# Patient Record
Sex: Female | Born: 1945
Health system: Southern US, Community
[De-identification: ages and names within clinical notes are randomized; demographics above are authoritative.]

## PROBLEM LIST (undated history)

## (undated) DIAGNOSIS — E119 Type 2 diabetes mellitus without complications: Secondary | ICD-10-CM

## (undated) DIAGNOSIS — I739 Peripheral vascular disease, unspecified: Secondary | ICD-10-CM

## (undated) DIAGNOSIS — R0789 Other chest pain: Secondary | ICD-10-CM

## (undated) DIAGNOSIS — E7800 Pure hypercholesterolemia, unspecified: Secondary | ICD-10-CM

## (undated) DIAGNOSIS — K5792 Diverticulitis of intestine, part unspecified, without perforation or abscess without bleeding: Secondary | ICD-10-CM

## (undated) DIAGNOSIS — I10 Essential (primary) hypertension: Secondary | ICD-10-CM

## (undated) DIAGNOSIS — N189 Chronic kidney disease, unspecified: Secondary | ICD-10-CM

## (undated) DIAGNOSIS — E785 Hyperlipidemia, unspecified: Secondary | ICD-10-CM

## (undated) HISTORY — PX: ABDOMINAL HYSTERECTOMY: SHX81

## (undated) HISTORY — PX: FEMORAL ARTERY STENT: SHX1583

---

## 1998-01-30 ENCOUNTER — Inpatient Hospital Stay (HOSPITAL_COMMUNITY): Admission: EM | Admit: 1998-01-30 | Discharge: 1998-02-01 | Payer: Self-pay | Admitting: Emergency Medicine

## 2011-06-04 ENCOUNTER — Emergency Department (HOSPITAL_BASED_OUTPATIENT_CLINIC_OR_DEPARTMENT_OTHER)
Admission: EM | Admit: 2011-06-04 | Discharge: 2011-06-04 | Disposition: A | Discharge status: Home / Self Care | Payer: Medicare Other | Attending: Emergency Medicine | Admitting: Emergency Medicine

## 2011-06-04 ENCOUNTER — Encounter: Payer: Self-pay | Admitting: Emergency Medicine

## 2011-06-04 ENCOUNTER — Emergency Department (INDEPENDENT_AMBULATORY_CARE_PROVIDER_SITE_OTHER): Payer: Medicare Other

## 2011-06-04 ENCOUNTER — Other Ambulatory Visit: Payer: Self-pay

## 2011-06-04 DIAGNOSIS — E785 Hyperlipidemia, unspecified: Secondary | ICD-10-CM | POA: Insufficient documentation

## 2011-06-04 DIAGNOSIS — F17200 Nicotine dependence, unspecified, uncomplicated: Secondary | ICD-10-CM | POA: Insufficient documentation

## 2011-06-04 DIAGNOSIS — R0789 Other chest pain: Secondary | ICD-10-CM

## 2011-06-04 DIAGNOSIS — R079 Chest pain, unspecified: Secondary | ICD-10-CM | POA: Insufficient documentation

## 2011-06-04 DIAGNOSIS — I10 Essential (primary) hypertension: Secondary | ICD-10-CM | POA: Insufficient documentation

## 2011-06-04 DIAGNOSIS — R071 Chest pain on breathing: Secondary | ICD-10-CM | POA: Insufficient documentation

## 2011-06-04 DIAGNOSIS — I517 Cardiomegaly: Secondary | ICD-10-CM

## 2011-06-04 DIAGNOSIS — Z79899 Other long term (current) drug therapy: Secondary | ICD-10-CM | POA: Insufficient documentation

## 2011-06-04 HISTORY — DX: Essential (primary) hypertension: I10

## 2011-06-04 HISTORY — DX: Peripheral vascular disease, unspecified: I73.9

## 2011-06-04 HISTORY — DX: Hyperlipidemia, unspecified: E78.5

## 2011-06-04 LAB — BASIC METABOLIC PANEL
GFR calc Af Amer: >60 mL/min (ref >60)
GFR calc non Af Amer: >60 mL/min (ref >60)
Potassium: 4 mEq/L (ref 3.5–5.1)
Sodium: 136 mEq/L (ref 135–145)

## 2011-06-04 LAB — CARDIAC PANEL(CRET KIN+CKTOT+MB+TROPI)
CK, MB: 2.5 ng/mL (ref 0.3–4.0)
Total CK: 99 U/L (ref 7–177)
Troponin I: <0.3 ng/mL (ref <0.30)

## 2011-06-04 LAB — CBC
MCHC: 33.4 g/dL (ref 30.0–36.0)
RDW: 18.2 % — ABNORMAL HIGH (ref 11.5–15.5)

## 2011-06-04 LAB — HEPATIC FUNCTION PANEL
Bilirubin, Direct: <0.1 mg/dL (ref 0.0–0.3)
Total Bilirubin: 0.2 mg/dL — ABNORMAL LOW (ref 0.3–1.2)

## 2011-06-04 MED ORDER — CIPROFLOXACIN IN D5W 400 MG/200ML IV SOLN
400.0000 mg | Freq: Once | INTRAVENOUS | Status: DC
Start: 2011-06-04 — End: 2011-06-04

## 2011-06-04 NOTE — ED Provider Notes (Signed)
History    the patient presents with left axilla pain. She was the sudden onset of pain as she was reaching up overhead to/sharp her back. The pain is sharp nonradiating, and focally about the midpoint of the left axilla. She recalls a sensation of shortness of breath. She denies other pain at that time. The pain lasted a few moments, and resolved spontaneously. The patient on ED presentation has no complaints. She notes that she has a history of prior cardiac evaluation within the last 2 years, this includes a negative chemical stress test. She has been her usual state of health, denies any focal changes other than the events preceding to presentation.  CSN: [id number] Arrival date & time: 06/04/2011 12:16 AM  Chief Complaint  Patient presents with  . Chest Pain    HPI  (Consider location/radiation/quality/duration/timing/severity/associated sxs/prior treatment)  HPI  Past Medical History  Diagnosis Date  . Hypertension   . Hyperlipidemia   . Peripheral vascular disease     Past Surgical History  Procedure Date  . Femoral artery stent   . Abdominal hysterectomy     No family history on file.  History  Substance Use Topics  . Smoking status: Current Some Day Smoker  . Smokeless tobacco: Not on file  . Alcohol Use: No    OB History    Grav Para Term Preterm Abortions TAB SAB Ect Mult Living                  Review of Systems  Review of Systems Gen: Per HPI HEENT: No HA CV: No CP Resp: No dyspnea Abd: Per HPI, otherwise negative Musk: Per HPI, otherwise negative Neuro: No dysesthesia, or focal changes GU: Per HPI, otherwise negative Skin: Neg Psych: Neg  Allergies  Review of patient's allergies indicates no known allergies.  Home Medications   Current Outpatient Rx  Name Route Sig Dispense Refill  . B-COMPLEX/B-12 PO Oral Take by mouth.      . CLOPIDOGREL BISULFATE 75 MG PO TABS Oral Take 75 mg by mouth daily.      Marland Kitchen HYDROCHLOROTHIAZIDE 25 MG PO TABS  Oral Take 25 mg by mouth daily.      Marland Kitchen LISINOPRIL 40 MG PO TABS Oral Take 40 mg by mouth daily.      Marland Kitchen PRAVASTATIN SODIUM 20 MG PO TABS Oral Take 20 mg by mouth daily.        Physical Exam    BP 118/58  Pulse 70  Temp(Src) 97.9 F (36.6 C) (Oral)  Resp 18  SpO2 100%  Physical Exam  Constitutional: She is oriented to person, place, and time. She appears well-developed and well-nourished.  HENT:  Head: Normocephalic and atraumatic.  Eyes: EOM are normal.  Cardiovascular: Normal rate and regular rhythm.   Pulmonary/Chest: Effort normal and breath sounds normal. No respiratory distress. She has no wheezes. She has no rales. She exhibits no tenderness.  Abdominal: She exhibits no distension.  Musculoskeletal: She exhibits no edema and no tenderness.  Neurological: She is alert and oriented to person, place, and time.  Skin: Skin is warm and dry.    ED Course  Procedures (including critical care time)  Labs Reviewed  CBC - Abnormal; Notable for the following:    RDW 18.2 (*)    All other components within normal limits  BASIC METABOLIC PANEL - Abnormal; Notable for the following:    Glucose, Bld 105 (*)    All other components within normal limits  HEPATIC FUNCTION  PANEL - Abnormal; Notable for the following:    Total Bilirubin 0.2 (*)    All other components within normal limits  CARDIAC PANEL(CRET KIN+CKTOT+MB+TROPI)   Dg Chest 2 View  06/04/2011  *RADIOLOGY REPORT*  Clinical Data: Left upper chest pain.  CHEST - 2 VIEW  Comparison: None.  Findings: Heart size is upper normal.  Normal mediastinal and hilar contours.  Normal pulmonary vascularity.  The lungs are clear. There is no pleural effusion or pneumothorax.  There are degenerative changes of the thoracic spine.  No acute bony abnormality.  IMPRESSION: No acute   Date: 06/04/2011  Rate:52  Rhythm: sinus bradycardia  QRS Axis: normal  Intervals: normal  ST/T Wave abnormalities: normal  Conduction Disutrbances:none   Narrative Interpretation: Q waves (borderline) anteriorly  Old EKG Reviewed: none available   cardiopulmonary disease. Borderline cardiomegaly.  Original Report Authenticated By: Britta Mccreedy, M.D.     No diagnosis found.   MDM This 65 year old female presents with the acute onset of left axillary pain that occurred while she was reaching above her head for a shower curtain. The transient nature of the pain, the patient's absence of complaints on exam, her stable vital signs, are unremarkable lab evaluation, her EKG is nonischemic, and her chest x-rays which does not demonstrate acute findings are all reassuring. The patient also has a relatively recent stress echocardiogram which was "normal". Absent no complaints, with the patient's stable condition, she prefers to go home rather than stay for further evaluation. She was instructed to followup with her cardiologist, as well as her primary care physician.        Gerhard Munch, MD 06/04/11 [phone]

## 2011-06-04 NOTE — ED Notes (Signed)
Pt states she had sudden onset of sharp pain under left breast tonight after sliding shower curtain back. Pt reports pain was worse with breathing. Pt denies pain at this time.

## 2012-05-02 ENCOUNTER — Emergency Department (HOSPITAL_BASED_OUTPATIENT_CLINIC_OR_DEPARTMENT_OTHER): Payer: Medicare Other

## 2012-05-02 ENCOUNTER — Encounter (HOSPITAL_BASED_OUTPATIENT_CLINIC_OR_DEPARTMENT_OTHER): Payer: Self-pay | Admitting: Emergency Medicine

## 2012-05-02 ENCOUNTER — Emergency Department (HOSPITAL_BASED_OUTPATIENT_CLINIC_OR_DEPARTMENT_OTHER)
Admission: EM | Admit: 2012-05-02 | Discharge: 2012-05-02 | Disposition: A | Discharge status: Other Acute Inpatient Hospital | Payer: Medicare Other | Attending: Emergency Medicine | Admitting: Emergency Medicine

## 2012-05-02 DIAGNOSIS — K5732 Diverticulitis of large intestine without perforation or abscess without bleeding: Secondary | ICD-10-CM | POA: Insufficient documentation

## 2012-05-02 DIAGNOSIS — I10 Essential (primary) hypertension: Secondary | ICD-10-CM | POA: Insufficient documentation

## 2012-05-02 DIAGNOSIS — E785 Hyperlipidemia, unspecified: Secondary | ICD-10-CM | POA: Insufficient documentation

## 2012-05-02 DIAGNOSIS — F17200 Nicotine dependence, unspecified, uncomplicated: Secondary | ICD-10-CM | POA: Insufficient documentation

## 2012-05-02 DIAGNOSIS — K630 Abscess of intestine: Secondary | ICD-10-CM | POA: Insufficient documentation

## 2012-05-02 DIAGNOSIS — K5792 Diverticulitis of intestine, part unspecified, without perforation or abscess without bleeding: Secondary | ICD-10-CM

## 2012-05-02 DIAGNOSIS — Z79899 Other long term (current) drug therapy: Secondary | ICD-10-CM | POA: Insufficient documentation

## 2012-05-02 HISTORY — DX: Diverticulitis of intestine, part unspecified, without perforation or abscess without bleeding: K57.92

## 2012-05-02 LAB — CBC WITH DIFFERENTIAL/PLATELET
Basophils Absolute: 0 K/uL (ref 0.0–0.1)
Basophils Relative: 0 % (ref 0–1)
Eosinophils Absolute: 0.1 10*3/uL (ref 0.0–0.7)
Eosinophils Relative: 1 % (ref 0–5)
HCT: 35.5 % — ABNORMAL LOW (ref 36.0–46.0)
Hemoglobin: 12 g/dL (ref 12.0–15.0)
Lymphocytes Relative: 19 % (ref 12–46)
Lymphs Abs: 2 10*3/uL (ref 0.7–4.0)
MCH: 26.9 pg (ref 26.0–34.0)
MCHC: 33.8 g/dL (ref 30.0–36.0)
MCV: 79.6 fL (ref 78.0–100.0)
Monocytes Absolute: 0.9 K/uL (ref 0.1–1.0)
Monocytes Relative: 9 % (ref 3–12)
Neutro Abs: 7.6 10*3/uL (ref 1.7–7.7)
Neutrophils Relative %: 72 % (ref 43–77)
Platelets: 292 K/uL (ref 150–400)
RBC: 4.46 MIL/uL (ref 3.87–5.11)
RDW: 15.3 % (ref 11.5–15.5)
WBC: 10.6 K/uL — ABNORMAL HIGH (ref 4.0–10.5)

## 2012-05-02 LAB — URINALYSIS, ROUTINE W REFLEX MICROSCOPIC
Bilirubin Urine: NEGATIVE
Glucose, UA: NEGATIVE mg/dL
Ketones, ur: NEGATIVE mg/dL
Nitrite: NEGATIVE
Protein, ur: NEGATIVE mg/dL
Specific Gravity, Urine: 1.021 (ref 1.005–1.030)
Urobilinogen, UA: 1 mg/dL (ref 0.0–1.0)
pH: 5.5 (ref 5.0–8.0)

## 2012-05-02 LAB — COMPREHENSIVE METABOLIC PANEL
Alkaline Phosphatase: 62 U/L (ref 39–117)
BUN: 15 mg/dL (ref 6–23)
CO2: 26 mEq/L (ref 19–32)
Chloride: 100 mEq/L (ref 96–112)
GFR calc Af Amer: 68 mL/min — ABNORMAL LOW (ref >90)
GFR calc non Af Amer: 59 mL/min — ABNORMAL LOW (ref >90)
Glucose, Bld: 98 mg/dL (ref 70–99)
Potassium: 3.6 mEq/L (ref 3.5–5.1)
Total Bilirubin: 0.4 mg/dL (ref 0.3–1.2)

## 2012-05-02 LAB — COMPREHENSIVE METABOLIC PANEL WITH GFR
ALT: 12 U/L (ref 0–35)
AST: 18 U/L (ref 0–37)
Albumin: 3.2 g/dL — ABNORMAL LOW (ref 3.5–5.2)
Calcium: 9.5 mg/dL (ref 8.4–10.5)
Creatinine, Ser: 1 mg/dL (ref 0.50–1.10)
Sodium: 136 meq/L (ref 135–145)
Total Protein: 7.1 g/dL (ref 6.0–8.3)

## 2012-05-02 LAB — URINE MICROSCOPIC-ADD ON

## 2012-05-02 LAB — LIPASE, BLOOD: Lipase: 14 U/L (ref 11–59)

## 2012-05-02 MED ORDER — ONDANSETRON HCL 4 MG/2ML IJ SOLN
4.0000 mg | Freq: Once | INTRAMUSCULAR | Status: AC
  Administered 2012-05-02: 4 mg via INTRAVENOUS

## 2012-05-02 MED ORDER — PIPERACILLIN-TAZOBACTAM 3.375 G IVPB
3.3750 g | Freq: Once | INTRAVENOUS | Status: DC
  Administered 2012-05-02: 3.375 g via INTRAVENOUS
  Filled 2012-05-02: qty 50

## 2012-05-02 MED ORDER — ONDANSETRON HCL 4 MG/2ML IJ SOLN
INTRAMUSCULAR | Status: AC
  Administered 2012-05-02: 4 mg via INTRAVENOUS
  Filled 2012-05-02: qty 2

## 2012-05-02 MED ORDER — HYDROMORPHONE HCL PF 1 MG/ML IJ SOLN
INTRAMUSCULAR | Status: AC
  Administered 2012-05-02: 1 mg via INTRAVENOUS
  Filled 2012-05-02: qty 1

## 2012-05-02 MED ORDER — HYDROMORPHONE HCL PF 1 MG/ML IJ SOLN
1.0000 mg | Freq: Once | INTRAMUSCULAR | Status: AC
  Administered 2012-05-02: 1 mg via INTRAVENOUS

## 2012-05-02 MED ORDER — HYDROMORPHONE HCL PF 1 MG/ML IJ SOLN
1.0000 mg | Freq: Once | INTRAMUSCULAR | Status: AC
  Administered 2012-05-02: 1 mg via INTRAVENOUS
  Filled 2012-05-02: qty 1

## 2012-05-02 MED ORDER — IOHEXOL 300 MG/ML  SOLN
100.0000 mL | Freq: Once | INTRAMUSCULAR | Status: AC | PRN
  Administered 2012-05-02: 100 mL via INTRAVENOUS

## 2012-05-02 MED ORDER — SODIUM CHLORIDE 0.9 % IV SOLN
Freq: Once | INTRAVENOUS | Status: AC
  Administered 2012-05-02: 15:00:00 via INTRAVENOUS

## 2012-05-02 MED ORDER — ONDANSETRON HCL 4 MG/2ML IJ SOLN
4.0000 mg | Freq: Once | INTRAMUSCULAR | Status: AC
  Administered 2012-05-02: 4 mg via INTRAVENOUS
  Filled 2012-05-02: qty 2

## 2012-05-02 NOTE — ED Notes (Signed)
Pt having LLQ abdominal pain and dysuria since Tuesday.  Pt seen at [facility] for same and admitted with diverticulosis.  Pt discharged on Thursday but continues to have pain.  Fever/chills since Friday, and now hematuria.

## 2012-05-02 NOTE — ED Provider Notes (Signed)
Ct Abdomen Pelvis W Contrast  05/02/2012  *RADIOLOGY REPORT*  Clinical Data: Abdominal pain and fever.  CT ABDOMEN AND PELVIS WITH CONTRAST  Technique:  Multidetector CT imaging of the abdomen and pelvis was performed following the standard protocol during bolus administration of intravenous contrast.  Contrast: OMNIPAQUE IOHEXOL 300 MG/ML  SOLN  Comparison: None.  Findings: The lung bases are clear except for dependent atelectasis.  The liver is unremarkable.  No focal lesions or intrahepatic ductal dilatation.  The gallbladder is normal.  No common bile duct dilatation.  The pancreas is normal.  The spleen is normal.  The adrenal glands and kidneys are unremarkable.  The stomach, duodenum, and small bowel are unremarkable.  There is focal severe diverticulitis involving the upper sigmoid colon with an adjacent 3 cm diverticular abscess and marked surrounding inflammation.  No free air.  The remainder the colon is unremarkable.  The appendix is normal.  There are small scattered mesenteric and retroperitoneal lymph nodes but no mass or adenopathy.  The aorta is normal in caliber.  Atherosclerotic changes are noted.  The uterus is surgically absent.  The left ovary is normal.  The right ovary is not visualized.  The bladder is normal.  The bony structures are unremarkable.  IMPRESSION:  Acute sigmoid diverticulitis with an adjacent 3 cm diverticular abscess.   Original Report Authenticated By: P. Loralie Champagne, M.D.     Patient's CT scan now shows a 3 cm diverticular abscess. Patient was recently admitted at [facility]. Considering that she is not improving and may have now developed a diverticular abscess she will be admitted to the hospital there for further treatment. I've spoken with the surgeon on call who will evaluate the patient. The plan and findings have been discussed with the patient  Celene Kras, MD 05/02/12 [phone]

## 2012-05-02 NOTE — ED Provider Notes (Signed)
History     CSN: [id number]  Arrival date & time 05/02/12  1212   First MD Initiated Contact with Patient 05/02/12 1431      Chief Complaint  Patient presents with  . Abdominal Pain  . Fever  . Hematuria    (Consider location/radiation/quality/duration/timing/severity/associated sxs/prior treatment) HPI Comments: Pt reports left side lower abd pain since 10 days ago and was seen at [facility] ED 5 days ago, was diagnosed with diverticulitis and admitted for 2 days.  She received IV abx for 2 days, but pain wasn't getting much better.  She was released thinking that she could take abx at home.  However, at home, she is taking percocet and oral abx, but still is not feeling better, certain movements like steeping up and walking makes pain worse.  She also has seen blood and dark urine.  No dysuria.  Pain does radiate to left flank as well.  No N/V.  Appetite is down.  Some chills noted, no fever.  She comes here because of new symptoms and pain not improving.    Patient is a 66 y.o. female presenting with abdominal pain, fever, and hematuria. The history is provided by the patient.  Abdominal Pain The primary symptoms of the illness include abdominal pain. The primary symptoms of the illness do not include nausea, vomiting or diarrhea.  Additional symptoms associated with the illness include chills and hematuria. Symptoms associated with the illness do not include constipation or back pain.  Fever Primary symptoms of the febrile illness include abdominal pain. Primary symptoms do not include headaches, nausea, vomiting or diarrhea.  Hematuria Associated symptoms include abdominal pain and chills. Pertinent negatives include no nausea or vomiting.    Past Medical History  Diagnosis Date  . Hypertension   . Hyperlipidemia   . Peripheral vascular disease   . Diverticulitis     Past Surgical History  Procedure Date  . Femoral artery stent   . Abdominal hysterectomy     No family  history on file.  History  Substance Use Topics  . Smoking status: Current Some Day Smoker  . Smokeless tobacco: Not on file  . Alcohol Use: No    OB History    Grav Para Term Preterm Abortions TAB SAB Ect Mult Living                  Review of Systems  Constitutional: Positive for chills and appetite change.  Gastrointestinal: Positive for abdominal pain. Negative for nausea, vomiting, diarrhea and constipation.  Genitourinary: Positive for hematuria.  Musculoskeletal: Negative for back pain.  Neurological: Negative for weakness and headaches.  All other systems reviewed and are negative.    Allergies  Review of patient's allergies indicates no known allergies.  Home Medications   Current Outpatient Rx  Name Route Sig Dispense Refill  . ALLOPURINOL 100 MG PO TABS Oral Take 100 mg by mouth daily.    . B-COMPLEX/B-12 PO Oral Take by mouth.      . CLOPIDOGREL BISULFATE 75 MG PO TABS Oral Take 75 mg by mouth daily.      . ERGOCALCIFEROL 50000 UNITS PO CAPS Oral Take 50,000 Units by mouth once a week.    Marland Kitchen HYDROCHLOROTHIAZIDE 25 MG PO TABS Oral Take 25 mg by mouth daily.      Marland Kitchen LEVOFLOXACIN 500 MG PO TABS Oral Take 500 mg by mouth daily.    Marland Kitchen LISINOPRIL 40 MG PO TABS Oral Take 40 mg by mouth daily.      Marland Kitchen  METRONIDAZOLE 500 MG PO TABS Oral Take 500 mg by mouth 3 (three) times daily.    Marland Kitchen OMEPRAZOLE 40 MG PO CPDR Oral Take 40 mg by mouth daily.    . OXYCODONE HCL ER 10 MG PO TB12 Oral Take 10 mg by mouth every 4 (four) hours as needed.    Marland Kitchen PRAVASTATIN SODIUM 20 MG PO TABS Oral Take 10 mg by mouth daily.       BP 111/86  Pulse 60  Temp 98.2 F (36.8 C) (Oral)  Resp 16  Ht 5\' 2"  (1.575 m)  Wt 240 lb (108.863 kg)  BMI 43.90 kg/m2  SpO2 100%  Physical Exam  Nursing note and vitals reviewed. Constitutional: She appears well-developed and well-nourished.  HENT:  Head: Normocephalic and atraumatic.  Eyes: EOM are normal. Pupils are equal, round, and reactive to light.    Neck: Normal range of motion. Neck supple.  Cardiovascular: Normal rate.   Pulmonary/Chest: Effort normal. No respiratory distress. She has no wheezes.  Abdominal: Soft. Normal appearance. She exhibits no distension. Bowel sounds are decreased. There is tenderness in the left lower quadrant. There is no rigidity, no rebound, no tenderness at McBurney's point and negative Murphy's sign.  Neurological: She is alert.  Skin: Skin is warm and dry. No rash noted.    ED Course  Procedures (including critical care time)  Labs Reviewed  URINALYSIS, ROUTINE W REFLEX MICROSCOPIC - Abnormal; Notable for the following:    Color, Urine AMBER (*)  BIOCHEMICALS MAY BE AFFECTED BY COLOR   APPearance CLOUDY (*)     Hgb urine dipstick TRACE (*)     Leukocytes, UA SMALL (*)     All other components within normal limits  URINE MICROSCOPIC-ADD ON - Abnormal; Notable for the following:    Squamous Epithelial / LPF FEW (*)     Bacteria, UA MANY (*)     Casts HYALINE CASTS (*)     All other components within normal limits  CBC WITH DIFFERENTIAL - Abnormal; Notable for the following:    WBC 10.6 (*)     HCT 35.5 (*)     All other components within normal limits  COMPREHENSIVE METABOLIC PANEL  LIPASE, BLOOD  URINE CULTURE   No results found.   No diagnosis found.  RA sat is 100% and I interpret to be normal.    Will sign out to Dr. Lynelle Doctor for follow up on CT and recheck of symptoms.  Pt likely should be able to go home if CT is unremarkable.    MDM  I reviewed discharge note from [facility] which indicates pt had uncomplicated diverticulitis with initial WBC of 11 from ED eval.  Pt was being given flagyl and levaquin.  Pt will be rechecked here, IV meds, recheck CT for ureteral stone, abscess, perforation.  Otherwise not septic appearing.  abd is mildly tender on exam.          Gavin Pound. Yianna Tersigni, MD 05/02/12 1538

## 2012-05-04 LAB — URINE CULTURE: Colony Count: 4000

## 2013-03-14 ENCOUNTER — Encounter (HOSPITAL_BASED_OUTPATIENT_CLINIC_OR_DEPARTMENT_OTHER): Payer: Self-pay | Admitting: *Deleted

## 2013-03-14 ENCOUNTER — Emergency Department (HOSPITAL_BASED_OUTPATIENT_CLINIC_OR_DEPARTMENT_OTHER)
Admission: EM | Admit: 2013-03-14 | Discharge: 2013-03-14 | Disposition: A | Discharge status: Home / Self Care | Payer: Medicare PPO | Attending: Emergency Medicine | Admitting: Emergency Medicine

## 2013-03-14 DIAGNOSIS — M79609 Pain in unspecified limb: Secondary | ICD-10-CM | POA: Insufficient documentation

## 2013-03-14 DIAGNOSIS — Z79899 Other long term (current) drug therapy: Secondary | ICD-10-CM | POA: Insufficient documentation

## 2013-03-14 DIAGNOSIS — Z9889 Other specified postprocedural states: Secondary | ICD-10-CM | POA: Insufficient documentation

## 2013-03-14 DIAGNOSIS — Z8719 Personal history of other diseases of the digestive system: Secondary | ICD-10-CM | POA: Insufficient documentation

## 2013-03-14 DIAGNOSIS — E669 Obesity, unspecified: Secondary | ICD-10-CM | POA: Insufficient documentation

## 2013-03-14 DIAGNOSIS — R05 Cough: Secondary | ICD-10-CM | POA: Insufficient documentation

## 2013-03-14 DIAGNOSIS — N183 Chronic kidney disease, stage 3 unspecified: Secondary | ICD-10-CM | POA: Insufficient documentation

## 2013-03-14 DIAGNOSIS — I129 Hypertensive chronic kidney disease with stage 1 through stage 4 chronic kidney disease, or unspecified chronic kidney disease: Secondary | ICD-10-CM | POA: Insufficient documentation

## 2013-03-14 DIAGNOSIS — Z8679 Personal history of other diseases of the circulatory system: Secondary | ICD-10-CM | POA: Insufficient documentation

## 2013-03-14 DIAGNOSIS — R059 Cough, unspecified: Secondary | ICD-10-CM | POA: Insufficient documentation

## 2013-03-14 DIAGNOSIS — Z7902 Long term (current) use of antithrombotics/antiplatelets: Secondary | ICD-10-CM | POA: Insufficient documentation

## 2013-03-14 DIAGNOSIS — E785 Hyperlipidemia, unspecified: Secondary | ICD-10-CM | POA: Insufficient documentation

## 2013-03-14 DIAGNOSIS — M79652 Pain in left thigh: Secondary | ICD-10-CM

## 2013-03-14 DIAGNOSIS — Z87891 Personal history of nicotine dependence: Secondary | ICD-10-CM | POA: Insufficient documentation

## 2013-03-14 HISTORY — DX: Other chest pain: R07.89

## 2013-03-14 HISTORY — DX: Chronic kidney disease, unspecified: N18.9

## 2013-03-14 MED ORDER — OXYCODONE-ACETAMINOPHEN 5-325 MG PO TABS
1.0000 | ORAL_TABLET | Freq: Once | ORAL | Status: AC
  Administered 2013-03-14: 1 via ORAL
  Filled 2013-03-14 (×2): qty 1

## 2013-03-14 NOTE — ED Provider Notes (Signed)
History    CSN: [id number] Arrival date & time 03/14/13  1107  First MD Initiated Contact with Patient 03/14/13 1128     Chief Complaint  Patient presents with  . Leg Pain   (Consider location/radiation/quality/duration/timing/severity/associated sxs/prior Treatment) Patient is a 67 y.o. female presenting with leg pain. The history is provided by the patient. No language interpreter was used.  Leg Pain Location:  Leg (Pt developed a pain in her left eye last night. It felt like the pain she had when she had peripheral vascular disease had to have surgery and a stent in her left femoral artery in 2010.. she is continuing to have intermittent left thigh pain.) Time since incident:  18 hours Injury: no   Leg location:  L upper leg Pain details:    Quality: She says the pain is a sharp hard pain.   Radiates to:  Does not radiate Chronicity:  New Dislocation: no   Foreign body present:  No foreign bodies Prior injury to area: Prior left leg surgery with left femoral artery stent. Relieved by:  Nothing Worsened by:  Nothing tried Ineffective treatments:  None tried Associated symptoms: no decreased ROM and no fever    Past Medical History  Diagnosis Date  . Hypertension   . Hyperlipidemia   . Peripheral vascular disease   . Diverticulitis   . Chest pain, atypical   . Chronic renal failure     stage 3   Past Surgical History  Procedure Laterality Date  . Femoral artery stent    . Abdominal hysterectomy     No family history on file. History  Substance Use Topics  . Smoking status: Former Smoker    Quit date: 09/08/2008  . Smokeless tobacco: Never Used  . Alcohol Use: No   OB History   Grav Para Term Preterm Abortions TAB SAB Ect Mult Living                 Review of Systems  Constitutional: Negative for fever.  HENT: Negative.   Respiratory:       She noted a slight cough this morning.  Cardiovascular: Negative.   Gastrointestinal: Negative.   Genitourinary:  Negative.   Musculoskeletal:       Pain in left thigh.  Skin: Negative.   Neurological: Negative.   Psychiatric/Behavioral: Negative.     Allergies  Review of patient's allergies indicates no known allergies.  Home Medications   Current Outpatient Rx  Name  Route  Sig  Dispense  Refill  . HYDROcodone-acetaminophen (NORCO/VICODIN) 5-325 MG per tablet   Oral   Take 1 tablet by mouth every 6 (six) hours as needed for pain.         Marland Kitchen allopurinol (ZYLOPRIM) 100 MG tablet   Oral   Take 100 mg by mouth daily.         . B Complex Vitamins (B-COMPLEX/B-12 PO)   Oral   Take by mouth.           . clopidogrel (PLAVIX) 75 MG tablet   Oral   Take 75 mg by mouth daily.           . ergocalciferol (VITAMIN D2) 50000 UNITS capsule   Oral   Take 50,000 Units by mouth once a week.         . hydrochlorothiazide (HYDRODIURIL) 25 MG tablet   Oral   Take 25 mg by mouth daily.           Marland Kitchen levofloxacin (  LEVAQUIN) 500 MG tablet   Oral   Take 500 mg by mouth daily.         Marland Kitchen lisinopril (PRINIVIL,ZESTRIL) 40 MG tablet   Oral   Take 40 mg by mouth daily.           . metroNIDAZOLE (FLAGYL) 500 MG tablet   Oral   Take 500 mg by mouth 3 (three) times daily.         Marland Kitchen omeprazole (PRILOSEC) 40 MG capsule   Oral   Take 40 mg by mouth daily.         Marland Kitchen oxyCODONE (OXYCONTIN) 10 MG 12 hr tablet   Oral   Take 10 mg by mouth every 4 (four) hours as needed.         . pravastatin (PRAVACHOL) 20 MG tablet   Oral   Take 10 mg by mouth daily.           BP 125/67  Pulse 60  Temp(Src) 98 F (36.7 C) (Oral)  Resp 20  Ht 5\' 2"  (1.575 m)  Wt 250 lb (113.399 kg)  BMI 45.71 kg/m2  SpO2 98% Physical Exam  Nursing note and vitals reviewed. Constitutional: She is oriented to person, place, and time.  Obese elderly lady, in moderate distress with left thigh pain.  HENT:  Head: Normocephalic and atraumatic.  Right Ear: External ear normal.  Left Ear: External ear normal.   Mouth/Throat: Oropharynx is clear and moist.  Eyes: Conjunctivae and EOM are normal. Pupils are equal, round, and reactive to light.  Neck: Normal range of motion. Neck supple.  Cardiovascular: Normal rate, regular rhythm and normal heart sounds.   Pulmonary/Chest: Effort normal and breath sounds normal.  Abdominal: Soft. Bowel sounds are normal.  Musculoskeletal:  She localizes pain to the left mid thigh on the medial side of her thigh. There is no palpable form of tea, no redness heat or tenderness. No masses palpable. She has intact dorsalis pedis pulse. She has intact sensation and motor function in the left foot.  Neurological: She is alert and oriented to person, place, and time.  No sensory or motor deficit.  Skin: Skin is warm and dry.  Psychiatric: She has a normal mood and affect. Her behavior is normal.    ED Course  Procedures (including critical care time) BP in right arm 103/55                                  BP in left arm 112/67 BP in right leg 109/60                                    BP in left leg  120/66 ABI in right leg 1.06             ABI in left leg  1.06  12:29 PM Call to Angelina Ok, M.D., her vascular surgeon, for concern about possible arterial occlusion.  12:44 PM Case discussed with Corrin Parker, PA-C for Dr. Sondra Come.  She will see pt in the office now. She advised that Dr. Sondra Come is out of town, and if pt needs urgent vascular surgery Dr. Sondra Come is covered by [facility].   1. Left thigh pain         Carleene Cooper III, MD 03/14/13 1249

## 2013-03-14 NOTE — ED Notes (Signed)
Patient states she has pain in her left upper thigh for the last several months.  States she had similar pain in 2010 and was diagnosed with PVD requiring a stent placement in the femoral artery.  States that the left leg has been numb since 2010 surgery.  States last night the pain became more intense and she is concerned.

## 2014-01-23 ENCOUNTER — Other Ambulatory Visit (HOSPITAL_BASED_OUTPATIENT_CLINIC_OR_DEPARTMENT_OTHER): Payer: Self-pay | Admitting: Internal Medicine

## 2014-01-23 DIAGNOSIS — R079 Chest pain, unspecified: Secondary | ICD-10-CM

## 2014-01-24 ENCOUNTER — Ambulatory Visit (HOSPITAL_BASED_OUTPATIENT_CLINIC_OR_DEPARTMENT_OTHER)
Admission: RE | Admit: 2014-01-24 | Discharge: 2014-01-24 | Disposition: A | Discharge status: Home / Self Care | Payer: Medicare PPO | Source: Ambulatory Visit | Attending: Internal Medicine | Admitting: Internal Medicine

## 2014-01-24 ENCOUNTER — Encounter (HOSPITAL_BASED_OUTPATIENT_CLINIC_OR_DEPARTMENT_OTHER): Payer: Self-pay

## 2014-01-24 DIAGNOSIS — R079 Chest pain, unspecified: Secondary | ICD-10-CM

## 2014-01-24 MED ORDER — IOHEXOL 300 MG/ML  SOLN
80.0000 mL | Freq: Once | INTRAMUSCULAR | Status: AC | PRN
  Administered 2014-01-24: 80 mL via INTRAVENOUS

## 2014-03-14 ENCOUNTER — Other Ambulatory Visit (HOSPITAL_BASED_OUTPATIENT_CLINIC_OR_DEPARTMENT_OTHER): Payer: Self-pay | Admitting: Internal Medicine

## 2014-03-14 DIAGNOSIS — Z1231 Encounter for screening mammogram for malignant neoplasm of breast: Secondary | ICD-10-CM

## 2014-03-20 ENCOUNTER — Ambulatory Visit (HOSPITAL_BASED_OUTPATIENT_CLINIC_OR_DEPARTMENT_OTHER)
Admission: RE | Admit: 2014-03-20 | Discharge: 2014-03-20 | Disposition: A | Discharge status: Home / Self Care | Payer: Medicare PPO | Source: Ambulatory Visit | Attending: Internal Medicine | Admitting: Internal Medicine

## 2014-03-20 DIAGNOSIS — Z1231 Encounter for screening mammogram for malignant neoplasm of breast: Secondary | ICD-10-CM | POA: Insufficient documentation

## 2014-09-17 ENCOUNTER — Emergency Department (HOSPITAL_BASED_OUTPATIENT_CLINIC_OR_DEPARTMENT_OTHER)
Admission: EM | Admit: 2014-09-17 | Discharge: 2014-09-17 | Disposition: A | Discharge status: Home / Self Care | Payer: Medicare PPO | Attending: Emergency Medicine | Admitting: Emergency Medicine

## 2014-09-17 ENCOUNTER — Emergency Department (HOSPITAL_BASED_OUTPATIENT_CLINIC_OR_DEPARTMENT_OTHER): Payer: Medicare PPO

## 2014-09-17 ENCOUNTER — Encounter (HOSPITAL_BASED_OUTPATIENT_CLINIC_OR_DEPARTMENT_OTHER): Payer: Self-pay | Admitting: *Deleted

## 2014-09-17 DIAGNOSIS — I129 Hypertensive chronic kidney disease with stage 1 through stage 4 chronic kidney disease, or unspecified chronic kidney disease: Secondary | ICD-10-CM | POA: Diagnosis not present

## 2014-09-17 DIAGNOSIS — E785 Hyperlipidemia, unspecified: Secondary | ICD-10-CM | POA: Diagnosis not present

## 2014-09-17 DIAGNOSIS — Z792 Long term (current) use of antibiotics: Secondary | ICD-10-CM | POA: Insufficient documentation

## 2014-09-17 DIAGNOSIS — J40 Bronchitis, not specified as acute or chronic: Secondary | ICD-10-CM | POA: Diagnosis not present

## 2014-09-17 DIAGNOSIS — R05 Cough: Secondary | ICD-10-CM

## 2014-09-17 DIAGNOSIS — Z87891 Personal history of nicotine dependence: Secondary | ICD-10-CM | POA: Insufficient documentation

## 2014-09-17 DIAGNOSIS — R112 Nausea with vomiting, unspecified: Secondary | ICD-10-CM | POA: Diagnosis not present

## 2014-09-17 DIAGNOSIS — Z79899 Other long term (current) drug therapy: Secondary | ICD-10-CM | POA: Diagnosis not present

## 2014-09-17 DIAGNOSIS — R1013 Epigastric pain: Secondary | ICD-10-CM | POA: Diagnosis not present

## 2014-09-17 DIAGNOSIS — Z7902 Long term (current) use of antithrombotics/antiplatelets: Secondary | ICD-10-CM | POA: Diagnosis not present

## 2014-09-17 DIAGNOSIS — N183 Chronic kidney disease, stage 3 (moderate): Secondary | ICD-10-CM | POA: Insufficient documentation

## 2014-09-17 DIAGNOSIS — Z8719 Personal history of other diseases of the digestive system: Secondary | ICD-10-CM | POA: Insufficient documentation

## 2014-09-17 DIAGNOSIS — R059 Cough, unspecified: Secondary | ICD-10-CM

## 2014-09-17 DIAGNOSIS — R1110 Vomiting, unspecified: Secondary | ICD-10-CM | POA: Diagnosis present

## 2014-09-17 LAB — URINALYSIS, ROUTINE W REFLEX MICROSCOPIC
Bilirubin Urine: NEGATIVE
GLUCOSE, UA: NEGATIVE mg/dL
Ketones, ur: NEGATIVE mg/dL
Leukocytes, UA: NEGATIVE
Nitrite: NEGATIVE
PH: 6 (ref 5.0–8.0)
PROTEIN: NEGATIVE mg/dL
SPECIFIC GRAVITY, URINE: 1.019 (ref 1.005–1.030)
UROBILINOGEN UA: 1 mg/dL (ref 0.0–1.0)

## 2014-09-17 LAB — CBC WITH DIFFERENTIAL/PLATELET
BASOS ABS: 0 10*3/uL (ref 0.0–0.1)
BASOS PCT: 0 % (ref 0–1)
EOS ABS: 0 10*3/uL (ref 0.0–0.7)
Eosinophils Relative: 0 % (ref 0–5)
HEMATOCRIT: 38.5 % (ref 36.0–46.0)
Hemoglobin: 12.5 g/dL (ref 12.0–15.0)
LYMPHS ABS: 0.8 10*3/uL (ref 0.7–4.0)
LYMPHS PCT: 9 % — AB (ref 12–46)
MCH: 26 pg (ref 26.0–34.0)
MCHC: 32.5 g/dL (ref 30.0–36.0)
MCV: 80 fL (ref 78.0–100.0)
MONO ABS: 0.3 10*3/uL (ref 0.1–1.0)
MONOS PCT: 4 % (ref 3–12)
NEUTROS ABS: 7.3 10*3/uL (ref 1.7–7.7)
Neutrophils Relative %: 87 % — ABNORMAL HIGH (ref 43–77)
Platelets: 296 10*3/uL (ref 150–400)
RBC: 4.81 MIL/uL (ref 3.87–5.11)
RDW: 17 % — ABNORMAL HIGH (ref 11.5–15.5)
WBC: 8.4 10*3/uL (ref 4.0–10.5)

## 2014-09-17 LAB — URINE MICROSCOPIC-ADD ON

## 2014-09-17 LAB — COMPREHENSIVE METABOLIC PANEL
ALBUMIN: 4 g/dL (ref 3.5–5.2)
ALT: 25 U/L (ref 0–35)
AST: 37 U/L (ref 0–37)
Alkaline Phosphatase: 69 U/L (ref 39–117)
Anion gap: 9 (ref 5–15)
BILIRUBIN TOTAL: 0.5 mg/dL (ref 0.3–1.2)
BUN: 23 mg/dL (ref 6–23)
CALCIUM: 9.1 mg/dL (ref 8.4–10.5)
CO2: 24 mmol/L (ref 19–32)
Chloride: 102 mEq/L (ref 96–112)
Creatinine, Ser: 1.09 mg/dL (ref 0.50–1.10)
GFR calc Af Amer: 60 mL/min — ABNORMAL LOW (ref >90)
GFR, EST NON AFRICAN AMERICAN: 52 mL/min — AB (ref >90)
Glucose, Bld: 113 mg/dL — ABNORMAL HIGH (ref 70–99)
POTASSIUM: 3.6 mmol/L (ref 3.5–5.1)
Sodium: 135 mmol/L (ref 135–145)
Total Protein: 7.5 g/dL (ref 6.0–8.3)

## 2014-09-17 LAB — LIPASE, BLOOD: LIPASE: 24 U/L (ref 11–59)

## 2014-09-17 LAB — TROPONIN I

## 2014-09-17 MED ORDER — BENZONATATE 100 MG PO CAPS: 100.0000 mg | ORAL_CAPSULE | Freq: Three times a day (TID) | ORAL | Status: AC

## 2014-09-17 MED ORDER — ONDANSETRON 4 MG PO TBDP: 4.0000 mg | ORAL_TABLET | Freq: Three times a day (TID) | ORAL | Status: AC | PRN

## 2014-09-17 MED ORDER — ONDANSETRON HCL 4 MG/2ML IJ SOLN
4.0000 mg | Freq: Once | INTRAMUSCULAR | Status: AC
  Administered 2014-09-17: 4 mg via INTRAVENOUS
  Filled 2014-09-17: qty 2

## 2014-09-17 MED ORDER — HYDROCODONE-ACETAMINOPHEN 5-325 MG PO TABS
1.0000 | ORAL_TABLET | Freq: Once | ORAL | Status: AC
  Administered 2014-09-17: 1 via ORAL
  Filled 2014-09-17: qty 1

## 2014-09-17 MED ORDER — BENZONATATE 100 MG PO CAPS
100.0000 mg | ORAL_CAPSULE | Freq: Once | ORAL | Status: AC
  Administered 2014-09-17: 100 mg via ORAL
  Filled 2014-09-17: qty 1

## 2014-09-17 NOTE — Discharge Instructions (Signed)
Use your inhaler every 4 hours as needed, return to ER for worsening symptoms or pain or cough or fever or vomiting  Your testing was normal =- no signs of pneumonia.  Kidney function was normal tonight.  Please call your doctor for a followup appointment within 24-48 hours. When you talk to your doctor please let them know that you were seen in the emergency department and have them acquire all of your records so that they can discuss the findings with you and formulate a treatment plan to fully care for your new and ongoing problems.

## 2014-09-17 NOTE — ED Notes (Signed)
Patient state she has been vomiting since this morning, she states when she coughs she also vomits.

## 2014-09-17 NOTE — ED Provider Notes (Signed)
CSN: [id number]     Arrival date & time 09/17/14  1737 History  This chart was scribed for Holly Acosta, MD by Randa Evens, ED Scribe. This patient was seen in room MH11/MH11 and the patient's care was started at 7:53 PM.     Chief Complaint  Patient presents with  . Emesis   Patient is a 69 y.o. female presenting with vomiting. The history is provided by the patient. No language interpreter was used.  Emesis  HPI Comments: Ekta Dancer is a 69 y.o. female with PMHx HTN, hyperlipidemia, chest pain, chronic renal failure who presents to the Emergency Department complaining of vomiting onset 4 AM this morning. Pt states she has had a cough for 3 weeks. Pt states that the pain radiates into her chest and is worse when coughing. Pt states she has associated brown water diarrhea and nausea. Pt states that when she coughs she has bowel and bladder incontinence. Pt doesn't report any medications PTA. Pt doesn't report any other symptoms.  She denies any significant abdominal surgical history other than diverticulitis surgery many years ago, hysterectomy. She she does have what she states is a deep chest pain when she coughs.  Past Medical History  Diagnosis Date  . Hypertension   . Hyperlipidemia   . Peripheral vascular disease   . Diverticulitis   . Chest pain, atypical   . Chronic renal failure     stage 3   Past Surgical History  Procedure Laterality Date  . Femoral artery stent    . Abdominal hysterectomy     No family history on file. History  Substance Use Topics  . Smoking status: Former Smoker    Quit date: 09/08/2008  . Smokeless tobacco: Never Used  . Alcohol Use: No   OB History    No data available     Review of Systems  Gastrointestinal: Positive for vomiting.  All other systems reviewed and are negative.   Allergies  Review of patient's allergies indicates no known allergies.  Home Medications   Prior to Admission medications   Medication Sig Start  Date End Date Taking? Authorizing Provider  metFORMIN (GLUCOPHAGE) 500 MG tablet Take by mouth 2 (two) times daily with a meal.   Yes Historical Provider, MD  allopurinol (ZYLOPRIM) 100 MG tablet Take 100 mg by mouth daily.    Historical Provider, MD  B Complex Vitamins (B-COMPLEX/B-12 PO) Take by mouth.      Historical Provider, MD  benzonatate (TESSALON) 100 MG capsule Take 1 capsule (100 mg total) by mouth every 8 (eight) hours. 09/17/14   Holly Acosta, MD  clopidogrel (PLAVIX) 75 MG tablet Take 75 mg by mouth daily.      Historical Provider, MD  ergocalciferol (VITAMIN D2) 50000 UNITS capsule Take 50,000 Units by mouth once a week.    Historical Provider, MD  hydrochlorothiazide (HYDRODIURIL) 25 MG tablet Take 25 mg by mouth daily.      Historical Provider, MD  HYDROcodone-acetaminophen (NORCO/VICODIN) 5-325 MG per tablet Take 1 tablet by mouth every 6 (six) hours as needed for pain.    Historical Provider, MD  levofloxacin (LEVAQUIN) 500 MG tablet Take 500 mg by mouth daily.    Historical Provider, MD  lisinopril (PRINIVIL,ZESTRIL) 40 MG tablet Take 40 mg by mouth daily.      Historical Provider, MD  metroNIDAZOLE (FLAGYL) 500 MG tablet Take 500 mg by mouth 3 (three) times daily.    Historical Provider, MD  omeprazole (PRILOSEC) 40 MG  capsule Take 40 mg by mouth daily.    Historical Provider, MD  ondansetron (ZOFRAN ODT) 4 MG disintegrating tablet Take 1 tablet (4 mg total) by mouth every 8 (eight) hours as needed for nausea. 09/17/14   Holly Acosta, MD  oxyCODONE (OXYCONTIN) 10 MG 12 hr tablet Take 10 mg by mouth every 4 (four) hours as needed.    Historical Provider, MD  pravastatin (PRAVACHOL) 20 MG tablet Take 10 mg by mouth daily.     Historical Provider, MD   Triage Vitals: BP 104/82 mmHg  Pulse 106  Temp(Src) 98.6 F (37 C) (Oral)  Resp 18  Ht 5\' 2"  (1.575 m)  Wt 250 lb (113.399 kg)  BMI 45.71 kg/m2  SpO2 100%  Physical Exam  Constitutional: She appears well-developed and  well-nourished. No distress.  Morbidly obese, NAD.   HENT:  Head: Normocephalic and atraumatic.  Mouth/Throat: Oropharynx is clear and moist. No oropharyngeal exudate.  Eyes: Conjunctivae and EOM are normal. Pupils are equal, round, and reactive to light. Right eye exhibits no discharge. Left eye exhibits no discharge. No scleral icterus.  Neck: Normal range of motion. Neck supple. No JVD present. No thyromegaly present.  Cardiovascular: Normal rate, regular rhythm, normal heart sounds and intact distal pulses.  Exam reveals no gallop and no friction rub.   No murmur heard. Pulmonary/Chest: Effort normal and breath sounds normal. No respiratory distress. She has no wheezes. She has no rales.  Abdominal: Soft. Bowel sounds are normal. She exhibits no distension and no mass. There is no tenderness.  Mild tenderness across abdomen right epigastric and right upper.   Musculoskeletal: Normal range of motion. She exhibits no edema or tenderness.  Lymphadenopathy:    She has no cervical adenopathy.  Neurological: She is alert. Coordination normal.  Skin: Skin is warm and dry. No rash noted. No erythema.  Psychiatric: She has a normal mood and affect. Her behavior is normal.  Nursing note and vitals reviewed.   ED Course  Procedures (including critical care time) DIAGNOSTIC STUDIES: Oxygen Saturation is 100% on RA, normal by my interpretation.    COORDINATION OF CARE: 8:09 PM-Discussed treatment plan with pt at bedside and pt agreed to plan.     Labs Review Labs Reviewed  COMPREHENSIVE METABOLIC PANEL - Abnormal; Notable for the following:    Glucose, Bld 113 (*)    GFR calc non Af Amer 52 (*)    GFR calc Af Amer 60 (*)    All other components within normal limits  CBC WITH DIFFERENTIAL - Abnormal; Notable for the following:    RDW 17.0 (*)    Neutrophils Relative % 87 (*)    Lymphocytes Relative 9 (*)    All other components within normal limits  URINALYSIS, ROUTINE W REFLEX  MICROSCOPIC - Abnormal; Notable for the following:    Hgb urine dipstick SMALL (*)    All other components within normal limits  URINE MICROSCOPIC-ADD ON - Abnormal; Notable for the following:    Squamous Epithelial / LPF FEW (*)    Bacteria, UA FEW (*)    All other components within normal limits  LIPASE, BLOOD  TROPONIN I    Imaging Review Dg Chest 2 View  09/17/2014   CLINICAL DATA:  Initial evaluation for vomiting which began 4 a.m. this morning, has had a cough for 3 weeks, pain in chest  EXAM: CHEST  2 VIEW  COMPARISON:  None.  FINDINGS: The heart size and mediastinal contours are within normal  limits. Both lungs are clear. The visualized skeletal structures are unremarkable.  IMPRESSION: No active cardiopulmonary disease.   Electronically Signed   By: Skipper Cliche M.D.   On: 09/17/2014 21:23      MDM   Final diagnoses:  Cough  Non-intractable vomiting with nausea, vomiting of unspecified type  Bronchitis    VS normal, pt has had signficant improvement with meds as below - labs unremarkable, CXR without pna or other acute findings - pt is assymptomatic at this time - recommended supportive care and zofran - PO fluids prior to d/c successfully.  Meds given in ED:  Medications  ondansetron [facility]) injection 4 mg (4 mg Intravenous Given 09/17/14 2038)  benzonatate (TESSALON) capsule 100 mg (100 mg Oral Given 09/17/14 2041)  HYDROcodone-acetaminophen (NORCO/VICODIN) 5-325 MG per tablet 1 tablet (1 tablet Oral Given 09/17/14 2237)    New Prescriptions   BENZONATATE (TESSALON) 100 MG CAPSULE    Take 1 capsule (100 mg total) by mouth every 8 (eight) hours.   ONDANSETRON (ZOFRAN ODT) 4 MG DISINTEGRATING TABLET    Take 1 tablet (4 mg total) by mouth every 8 (eight) hours as needed for nausea.      I personally performed the services described in this documentation, which was scribed in my presence. The recorded information has been reviewed and is accurate.         Holly Acosta, MD 09/17/14 [phone]

## 2015-10-30 DIAGNOSIS — E119 Type 2 diabetes mellitus without complications: Secondary | ICD-10-CM | POA: Diagnosis not present

## 2015-10-30 DIAGNOSIS — I2510 Atherosclerotic heart disease of native coronary artery without angina pectoris: Secondary | ICD-10-CM | POA: Diagnosis not present

## 2015-10-30 DIAGNOSIS — N183 Chronic kidney disease, stage 3 (moderate): Secondary | ICD-10-CM | POA: Diagnosis not present

## 2015-10-30 DIAGNOSIS — I10 Essential (primary) hypertension: Secondary | ICD-10-CM | POA: Diagnosis not present

## 2015-10-30 DIAGNOSIS — M545 Low back pain: Secondary | ICD-10-CM | POA: Diagnosis not present

## 2015-10-30 DIAGNOSIS — K219 Gastro-esophageal reflux disease without esophagitis: Secondary | ICD-10-CM | POA: Diagnosis not present

## 2015-10-30 DIAGNOSIS — R109 Unspecified abdominal pain: Secondary | ICD-10-CM | POA: Diagnosis not present

## 2015-10-30 DIAGNOSIS — E785 Hyperlipidemia, unspecified: Secondary | ICD-10-CM | POA: Diagnosis not present

## 2015-11-01 DIAGNOSIS — I10 Essential (primary) hypertension: Secondary | ICD-10-CM | POA: Diagnosis not present

## 2015-11-01 DIAGNOSIS — N183 Chronic kidney disease, stage 3 (moderate): Secondary | ICD-10-CM | POA: Diagnosis not present

## 2015-11-13 DIAGNOSIS — I10 Essential (primary) hypertension: Secondary | ICD-10-CM | POA: Diagnosis not present

## 2015-11-13 DIAGNOSIS — N183 Chronic kidney disease, stage 3 (moderate): Secondary | ICD-10-CM | POA: Diagnosis not present

## 2015-11-21 DIAGNOSIS — R112 Nausea with vomiting, unspecified: Secondary | ICD-10-CM | POA: Diagnosis not present

## 2015-11-21 DIAGNOSIS — K219 Gastro-esophageal reflux disease without esophagitis: Secondary | ICD-10-CM | POA: Diagnosis not present

## 2015-11-21 DIAGNOSIS — R140 Abdominal distension (gaseous): Secondary | ICD-10-CM | POA: Diagnosis not present

## 2015-11-21 DIAGNOSIS — Z8719 Personal history of other diseases of the digestive system: Secondary | ICD-10-CM | POA: Diagnosis not present

## 2015-11-21 DIAGNOSIS — R630 Anorexia: Secondary | ICD-10-CM | POA: Diagnosis not present

## 2015-11-21 DIAGNOSIS — R1084 Generalized abdominal pain: Secondary | ICD-10-CM | POA: Diagnosis not present

## 2015-11-21 DIAGNOSIS — R194 Change in bowel habit: Secondary | ICD-10-CM | POA: Diagnosis not present

## 2015-11-28 DIAGNOSIS — R140 Abdominal distension (gaseous): Secondary | ICD-10-CM | POA: Diagnosis not present

## 2015-11-28 DIAGNOSIS — Z8719 Personal history of other diseases of the digestive system: Secondary | ICD-10-CM | POA: Diagnosis not present

## 2015-11-28 DIAGNOSIS — R1084 Generalized abdominal pain: Secondary | ICD-10-CM | POA: Diagnosis not present

## 2015-11-28 DIAGNOSIS — R109 Unspecified abdominal pain: Secondary | ICD-10-CM | POA: Diagnosis not present

## 2015-12-27 DIAGNOSIS — I10 Essential (primary) hypertension: Secondary | ICD-10-CM | POA: Diagnosis not present

## 2015-12-27 DIAGNOSIS — N183 Chronic kidney disease, stage 3 (moderate): Secondary | ICD-10-CM | POA: Diagnosis not present

## 2016-01-03 DIAGNOSIS — Z95820 Peripheral vascular angioplasty status with implants and grafts: Secondary | ICD-10-CM | POA: Diagnosis not present

## 2016-01-03 DIAGNOSIS — I70203 Unspecified atherosclerosis of native arteries of extremities, bilateral legs: Secondary | ICD-10-CM | POA: Diagnosis not present

## 2016-01-03 DIAGNOSIS — I700 Atherosclerosis of aorta: Secondary | ICD-10-CM | POA: Diagnosis not present

## 2016-01-03 DIAGNOSIS — I739 Peripheral vascular disease, unspecified: Secondary | ICD-10-CM | POA: Diagnosis not present

## 2016-01-16 DIAGNOSIS — Z7982 Long term (current) use of aspirin: Secondary | ICD-10-CM | POA: Diagnosis not present

## 2016-01-16 DIAGNOSIS — I10 Essential (primary) hypertension: Secondary | ICD-10-CM | POA: Diagnosis not present

## 2016-01-16 DIAGNOSIS — E1151 Type 2 diabetes mellitus with diabetic peripheral angiopathy without gangrene: Secondary | ICD-10-CM | POA: Diagnosis not present

## 2016-01-16 DIAGNOSIS — Z7901 Long term (current) use of anticoagulants: Secondary | ICD-10-CM | POA: Diagnosis not present

## 2016-01-16 DIAGNOSIS — I6523 Occlusion and stenosis of bilateral carotid arteries: Secondary | ICD-10-CM | POA: Diagnosis not present

## 2016-01-16 DIAGNOSIS — E7800 Pure hypercholesterolemia, unspecified: Secondary | ICD-10-CM | POA: Diagnosis not present

## 2016-01-16 DIAGNOSIS — I70203 Unspecified atherosclerosis of native arteries of extremities, bilateral legs: Secondary | ICD-10-CM | POA: Diagnosis not present

## 2016-01-24 DIAGNOSIS — N183 Chronic kidney disease, stage 3 (moderate): Secondary | ICD-10-CM | POA: Diagnosis not present

## 2016-01-24 DIAGNOSIS — I10 Essential (primary) hypertension: Secondary | ICD-10-CM | POA: Diagnosis not present

## 2016-01-31 DIAGNOSIS — D123 Benign neoplasm of transverse colon: Secondary | ICD-10-CM | POA: Diagnosis not present

## 2016-01-31 DIAGNOSIS — R1084 Generalized abdominal pain: Secondary | ICD-10-CM | POA: Diagnosis not present

## 2016-01-31 DIAGNOSIS — D122 Benign neoplasm of ascending colon: Secondary | ICD-10-CM | POA: Diagnosis not present

## 2016-01-31 DIAGNOSIS — D126 Benign neoplasm of colon, unspecified: Secondary | ICD-10-CM | POA: Diagnosis not present

## 2016-01-31 DIAGNOSIS — K2950 Unspecified chronic gastritis without bleeding: Secondary | ICD-10-CM | POA: Diagnosis not present

## 2016-01-31 DIAGNOSIS — R112 Nausea with vomiting, unspecified: Secondary | ICD-10-CM | POA: Diagnosis not present

## 2016-02-01 DIAGNOSIS — D123 Benign neoplasm of transverse colon: Secondary | ICD-10-CM | POA: Diagnosis not present

## 2016-02-01 DIAGNOSIS — D122 Benign neoplasm of ascending colon: Secondary | ICD-10-CM | POA: Diagnosis not present

## 2016-02-01 DIAGNOSIS — K2950 Unspecified chronic gastritis without bleeding: Secondary | ICD-10-CM | POA: Diagnosis not present

## 2016-03-17 DIAGNOSIS — R110 Nausea: Secondary | ICD-10-CM | POA: Diagnosis not present

## 2016-03-17 DIAGNOSIS — R112 Nausea with vomiting, unspecified: Secondary | ICD-10-CM | POA: Diagnosis not present

## 2016-06-10 DIAGNOSIS — R109 Unspecified abdominal pain: Secondary | ICD-10-CM | POA: Diagnosis not present

## 2016-06-10 DIAGNOSIS — I2510 Atherosclerotic heart disease of native coronary artery without angina pectoris: Secondary | ICD-10-CM | POA: Diagnosis not present

## 2016-06-10 DIAGNOSIS — E119 Type 2 diabetes mellitus without complications: Secondary | ICD-10-CM | POA: Diagnosis not present

## 2016-06-10 DIAGNOSIS — Z1389 Encounter for screening for other disorder: Secondary | ICD-10-CM | POA: Diagnosis not present

## 2016-06-10 DIAGNOSIS — Z0100 Encounter for examination of eyes and vision without abnormal findings: Secondary | ICD-10-CM | POA: Diagnosis not present

## 2016-06-10 DIAGNOSIS — Z01118 Encounter for examination of ears and hearing with other abnormal findings: Secondary | ICD-10-CM | POA: Diagnosis not present

## 2016-06-10 DIAGNOSIS — H538 Other visual disturbances: Secondary | ICD-10-CM | POA: Diagnosis not present

## 2016-06-10 DIAGNOSIS — Z0000 Encounter for general adult medical examination without abnormal findings: Secondary | ICD-10-CM | POA: Diagnosis not present

## 2016-06-10 DIAGNOSIS — I10 Essential (primary) hypertension: Secondary | ICD-10-CM | POA: Diagnosis not present

## 2016-06-10 DIAGNOSIS — N183 Chronic kidney disease, stage 3 (moderate): Secondary | ICD-10-CM | POA: Diagnosis not present

## 2016-06-10 DIAGNOSIS — M545 Low back pain: Secondary | ICD-10-CM | POA: Diagnosis not present

## 2016-06-10 DIAGNOSIS — E785 Hyperlipidemia, unspecified: Secondary | ICD-10-CM | POA: Diagnosis not present

## 2016-06-24 DIAGNOSIS — I10 Essential (primary) hypertension: Secondary | ICD-10-CM | POA: Diagnosis not present

## 2016-06-24 DIAGNOSIS — N183 Chronic kidney disease, stage 3 (moderate): Secondary | ICD-10-CM | POA: Diagnosis not present

## 2016-06-24 DIAGNOSIS — I2510 Atherosclerotic heart disease of native coronary artery without angina pectoris: Secondary | ICD-10-CM | POA: Diagnosis not present

## 2016-06-24 DIAGNOSIS — K219 Gastro-esophageal reflux disease without esophagitis: Secondary | ICD-10-CM | POA: Diagnosis not present

## 2016-06-24 DIAGNOSIS — E119 Type 2 diabetes mellitus without complications: Secondary | ICD-10-CM | POA: Diagnosis not present

## 2016-06-24 DIAGNOSIS — J45909 Unspecified asthma, uncomplicated: Secondary | ICD-10-CM | POA: Diagnosis not present

## 2016-06-24 DIAGNOSIS — E785 Hyperlipidemia, unspecified: Secondary | ICD-10-CM | POA: Diagnosis not present

## 2016-06-24 DIAGNOSIS — M545 Low back pain: Secondary | ICD-10-CM | POA: Diagnosis not present

## 2016-07-16 DIAGNOSIS — N390 Urinary tract infection, site not specified: Secondary | ICD-10-CM | POA: Diagnosis not present

## 2016-07-16 DIAGNOSIS — N189 Chronic kidney disease, unspecified: Secondary | ICD-10-CM | POA: Diagnosis not present

## 2016-07-22 DIAGNOSIS — Z95820 Peripheral vascular angioplasty status with implants and grafts: Secondary | ICD-10-CM | POA: Diagnosis not present

## 2016-07-22 DIAGNOSIS — I6523 Occlusion and stenosis of bilateral carotid arteries: Secondary | ICD-10-CM | POA: Diagnosis not present

## 2016-07-22 DIAGNOSIS — I70203 Unspecified atherosclerosis of native arteries of extremities, bilateral legs: Secondary | ICD-10-CM | POA: Diagnosis not present

## 2016-07-29 DIAGNOSIS — I6523 Occlusion and stenosis of bilateral carotid arteries: Secondary | ICD-10-CM | POA: Diagnosis not present

## 2016-07-29 DIAGNOSIS — Z7982 Long term (current) use of aspirin: Secondary | ICD-10-CM | POA: Diagnosis not present

## 2016-07-29 DIAGNOSIS — N183 Chronic kidney disease, stage 3 (moderate): Secondary | ICD-10-CM | POA: Diagnosis not present

## 2016-07-29 DIAGNOSIS — I70203 Unspecified atherosclerosis of native arteries of extremities, bilateral legs: Secondary | ICD-10-CM | POA: Diagnosis not present

## 2016-07-29 DIAGNOSIS — I739 Peripheral vascular disease, unspecified: Secondary | ICD-10-CM | POA: Diagnosis not present

## 2016-07-29 DIAGNOSIS — Z79899 Other long term (current) drug therapy: Secondary | ICD-10-CM | POA: Diagnosis not present

## 2016-07-29 DIAGNOSIS — Z87891 Personal history of nicotine dependence: Secondary | ICD-10-CM | POA: Diagnosis not present

## 2016-08-14 DIAGNOSIS — M4726 Other spondylosis with radiculopathy, lumbar region: Secondary | ICD-10-CM | POA: Diagnosis not present

## 2016-08-14 DIAGNOSIS — M4316 Spondylolisthesis, lumbar region: Secondary | ICD-10-CM | POA: Diagnosis not present

## 2016-08-14 DIAGNOSIS — M5431 Sciatica, right side: Secondary | ICD-10-CM | POA: Diagnosis not present

## 2016-08-14 DIAGNOSIS — M5432 Sciatica, left side: Secondary | ICD-10-CM | POA: Diagnosis not present

## 2016-08-14 DIAGNOSIS — M545 Low back pain: Secondary | ICD-10-CM | POA: Diagnosis not present

## 2016-08-27 DIAGNOSIS — M5416 Radiculopathy, lumbar region: Secondary | ICD-10-CM | POA: Diagnosis not present

## 2016-08-27 DIAGNOSIS — G8929 Other chronic pain: Secondary | ICD-10-CM | POA: Diagnosis not present

## 2016-08-27 DIAGNOSIS — Z7409 Other reduced mobility: Secondary | ICD-10-CM | POA: Diagnosis not present

## 2016-09-04 DIAGNOSIS — M5116 Intervertebral disc disorders with radiculopathy, lumbar region: Secondary | ICD-10-CM | POA: Diagnosis not present

## 2016-09-04 DIAGNOSIS — M4807 Spinal stenosis, lumbosacral region: Secondary | ICD-10-CM | POA: Diagnosis not present

## 2016-09-04 DIAGNOSIS — M1288 Other specific arthropathies, not elsewhere classified, other specified site: Secondary | ICD-10-CM | POA: Diagnosis not present

## 2016-09-04 DIAGNOSIS — M48061 Spinal stenosis, lumbar region without neurogenic claudication: Secondary | ICD-10-CM | POA: Diagnosis not present

## 2016-09-04 DIAGNOSIS — M545 Low back pain: Secondary | ICD-10-CM | POA: Diagnosis not present

## 2016-09-04 DIAGNOSIS — M4726 Other spondylosis with radiculopathy, lumbar region: Secondary | ICD-10-CM | POA: Diagnosis not present

## 2016-09-11 DIAGNOSIS — M5431 Sciatica, right side: Secondary | ICD-10-CM | POA: Diagnosis not present

## 2016-09-11 DIAGNOSIS — M5432 Sciatica, left side: Secondary | ICD-10-CM | POA: Diagnosis not present

## 2016-09-11 DIAGNOSIS — M4726 Other spondylosis with radiculopathy, lumbar region: Secondary | ICD-10-CM | POA: Diagnosis not present

## 2016-09-11 DIAGNOSIS — M4316 Spondylolisthesis, lumbar region: Secondary | ICD-10-CM | POA: Diagnosis not present

## 2016-10-24 DIAGNOSIS — M4316 Spondylolisthesis, lumbar region: Secondary | ICD-10-CM | POA: Diagnosis not present

## 2016-10-24 DIAGNOSIS — M47816 Spondylosis without myelopathy or radiculopathy, lumbar region: Secondary | ICD-10-CM | POA: Diagnosis not present

## 2016-11-04 DIAGNOSIS — M47816 Spondylosis without myelopathy or radiculopathy, lumbar region: Secondary | ICD-10-CM | POA: Diagnosis not present

## 2016-12-08 DIAGNOSIS — M47816 Spondylosis without myelopathy or radiculopathy, lumbar region: Secondary | ICD-10-CM | POA: Diagnosis not present

## 2017-01-06 DIAGNOSIS — E119 Type 2 diabetes mellitus without complications: Secondary | ICD-10-CM | POA: Diagnosis not present

## 2017-01-06 DIAGNOSIS — K219 Gastro-esophageal reflux disease without esophagitis: Secondary | ICD-10-CM | POA: Diagnosis not present

## 2017-01-06 DIAGNOSIS — I10 Essential (primary) hypertension: Secondary | ICD-10-CM | POA: Diagnosis not present

## 2017-01-06 DIAGNOSIS — I2510 Atherosclerotic heart disease of native coronary artery without angina pectoris: Secondary | ICD-10-CM | POA: Diagnosis not present

## 2017-01-06 DIAGNOSIS — J45909 Unspecified asthma, uncomplicated: Secondary | ICD-10-CM | POA: Diagnosis not present

## 2017-01-06 DIAGNOSIS — N183 Chronic kidney disease, stage 3 (moderate): Secondary | ICD-10-CM | POA: Diagnosis not present

## 2017-01-06 DIAGNOSIS — K439 Ventral hernia without obstruction or gangrene: Secondary | ICD-10-CM | POA: Diagnosis not present

## 2017-01-06 DIAGNOSIS — M545 Low back pain: Secondary | ICD-10-CM | POA: Diagnosis not present

## 2017-01-06 DIAGNOSIS — E785 Hyperlipidemia, unspecified: Secondary | ICD-10-CM | POA: Diagnosis not present

## 2017-01-20 DIAGNOSIS — R1011 Right upper quadrant pain: Secondary | ICD-10-CM | POA: Diagnosis not present

## 2017-01-20 DIAGNOSIS — R1906 Epigastric swelling, mass or lump: Secondary | ICD-10-CM | POA: Diagnosis not present

## 2017-01-26 DIAGNOSIS — K5730 Diverticulosis of large intestine without perforation or abscess without bleeding: Secondary | ICD-10-CM | POA: Diagnosis not present

## 2017-01-26 DIAGNOSIS — R109 Unspecified abdominal pain: Secondary | ICD-10-CM | POA: Diagnosis not present

## 2017-01-26 DIAGNOSIS — R1906 Epigastric swelling, mass or lump: Secondary | ICD-10-CM | POA: Diagnosis not present

## 2017-01-26 DIAGNOSIS — R1011 Right upper quadrant pain: Secondary | ICD-10-CM | POA: Diagnosis not present

## 2017-01-27 DIAGNOSIS — I70213 Atherosclerosis of native arteries of extremities with intermittent claudication, bilateral legs: Secondary | ICD-10-CM | POA: Diagnosis not present

## 2017-01-28 DIAGNOSIS — R1011 Right upper quadrant pain: Secondary | ICD-10-CM | POA: Diagnosis not present

## 2017-01-29 DIAGNOSIS — I70202 Unspecified atherosclerosis of native arteries of extremities, left leg: Secondary | ICD-10-CM | POA: Diagnosis not present

## 2017-01-29 DIAGNOSIS — I739 Peripheral vascular disease, unspecified: Secondary | ICD-10-CM | POA: Diagnosis not present

## 2017-02-19 DIAGNOSIS — I10 Essential (primary) hypertension: Secondary | ICD-10-CM | POA: Diagnosis not present

## 2017-02-19 DIAGNOSIS — N183 Chronic kidney disease, stage 3 (moderate): Secondary | ICD-10-CM | POA: Diagnosis not present

## 2017-02-19 DIAGNOSIS — E119 Type 2 diabetes mellitus without complications: Secondary | ICD-10-CM | POA: Diagnosis not present

## 2017-02-19 DIAGNOSIS — M545 Low back pain: Secondary | ICD-10-CM | POA: Diagnosis not present

## 2017-02-19 DIAGNOSIS — I2510 Atherosclerotic heart disease of native coronary artery without angina pectoris: Secondary | ICD-10-CM | POA: Diagnosis not present

## 2017-02-19 DIAGNOSIS — K219 Gastro-esophageal reflux disease without esophagitis: Secondary | ICD-10-CM | POA: Diagnosis not present

## 2017-02-19 DIAGNOSIS — E785 Hyperlipidemia, unspecified: Secondary | ICD-10-CM | POA: Diagnosis not present

## 2017-02-19 DIAGNOSIS — R05 Cough: Secondary | ICD-10-CM | POA: Diagnosis not present

## 2017-03-04 ENCOUNTER — Emergency Department (HOSPITAL_BASED_OUTPATIENT_CLINIC_OR_DEPARTMENT_OTHER): Payer: Medicare HMO

## 2017-03-04 ENCOUNTER — Encounter (HOSPITAL_BASED_OUTPATIENT_CLINIC_OR_DEPARTMENT_OTHER): Payer: Self-pay

## 2017-03-04 ENCOUNTER — Emergency Department (HOSPITAL_BASED_OUTPATIENT_CLINIC_OR_DEPARTMENT_OTHER)
Admission: EM | Admit: 2017-03-04 | Discharge: 2017-03-04 | Disposition: A | Discharge status: Home / Self Care | Payer: Medicare HMO | Attending: Emergency Medicine | Admitting: Emergency Medicine

## 2017-03-04 DIAGNOSIS — E1122 Type 2 diabetes mellitus with diabetic chronic kidney disease: Secondary | ICD-10-CM | POA: Diagnosis not present

## 2017-03-04 DIAGNOSIS — R0789 Other chest pain: Secondary | ICD-10-CM | POA: Diagnosis not present

## 2017-03-04 DIAGNOSIS — Z7984 Long term (current) use of oral hypoglycemic drugs: Secondary | ICD-10-CM | POA: Insufficient documentation

## 2017-03-04 DIAGNOSIS — Z8679 Personal history of other diseases of the circulatory system: Secondary | ICD-10-CM | POA: Insufficient documentation

## 2017-03-04 DIAGNOSIS — E1151 Type 2 diabetes mellitus with diabetic peripheral angiopathy without gangrene: Secondary | ICD-10-CM | POA: Diagnosis not present

## 2017-03-04 DIAGNOSIS — Z87891 Personal history of nicotine dependence: Secondary | ICD-10-CM | POA: Diagnosis not present

## 2017-03-04 DIAGNOSIS — I129 Hypertensive chronic kidney disease with stage 1 through stage 4 chronic kidney disease, or unspecified chronic kidney disease: Secondary | ICD-10-CM | POA: Insufficient documentation

## 2017-03-04 DIAGNOSIS — Z79899 Other long term (current) drug therapy: Secondary | ICD-10-CM | POA: Diagnosis not present

## 2017-03-04 DIAGNOSIS — Z7902 Long term (current) use of antithrombotics/antiplatelets: Secondary | ICD-10-CM | POA: Diagnosis not present

## 2017-03-04 DIAGNOSIS — N183 Chronic kidney disease, stage 3 (moderate): Secondary | ICD-10-CM | POA: Insufficient documentation

## 2017-03-04 DIAGNOSIS — R079 Chest pain, unspecified: Secondary | ICD-10-CM | POA: Insufficient documentation

## 2017-03-04 HISTORY — DX: Pure hypercholesterolemia, unspecified: E78.00

## 2017-03-04 HISTORY — DX: Type 2 diabetes mellitus without complications: E11.9

## 2017-03-04 LAB — COMPREHENSIVE METABOLIC PANEL
ALK PHOS: 54 U/L (ref 38–126)
ALT: 14 U/L (ref 14–54)
ANION GAP: 7 (ref 5–15)
AST: 19 U/L (ref 15–41)
Albumin: 3.3 g/dL — ABNORMAL LOW (ref 3.5–5.0)
BILIRUBIN TOTAL: 0.3 mg/dL (ref 0.3–1.2)
BUN: 15 mg/dL (ref 6–20)
CO2: 23 mmol/L (ref 22–32)
CREATININE: 1.06 mg/dL — AB (ref 0.44–1.00)
Calcium: 8.6 mg/dL — ABNORMAL LOW (ref 8.9–10.3)
Chloride: 108 mmol/L (ref 101–111)
GFR calc non Af Amer: 53 mL/min — ABNORMAL LOW (ref >60)
GLUCOSE: 76 mg/dL (ref 65–99)
Potassium: 3.3 mmol/L — ABNORMAL LOW (ref 3.5–5.1)
Sodium: 138 mmol/L (ref 135–145)
TOTAL PROTEIN: 6.7 g/dL (ref 6.5–8.1)

## 2017-03-04 LAB — LIPASE, BLOOD: Lipase: 27 U/L (ref 11–51)

## 2017-03-04 LAB — CBC WITH DIFFERENTIAL/PLATELET
Basophils Absolute: 0 10*3/uL (ref 0.0–0.1)
Basophils Relative: 0 %
Eosinophils Absolute: 0.2 10*3/uL (ref 0.0–0.7)
Eosinophils Relative: 2 %
HEMATOCRIT: 33.5 % — AB (ref 36.0–46.0)
HEMOGLOBIN: 11.2 g/dL — AB (ref 12.0–15.0)
LYMPHS ABS: 3.1 10*3/uL (ref 0.7–4.0)
LYMPHS PCT: 30 %
MCH: 26.7 pg (ref 26.0–34.0)
MCHC: 33.4 g/dL (ref 30.0–36.0)
MCV: 79.8 fL (ref 78.0–100.0)
MONOS PCT: 7 %
Monocytes Absolute: 0.7 10*3/uL (ref 0.1–1.0)
NEUTROS ABS: 6.1 10*3/uL (ref 1.7–7.7)
NEUTROS PCT: 61 %
Platelets: 311 10*3/uL (ref 150–400)
RBC: 4.2 MIL/uL (ref 3.87–5.11)
RDW: 17.5 % — ABNORMAL HIGH (ref 11.5–15.5)
WBC: 10 10*3/uL (ref 4.0–10.5)

## 2017-03-04 LAB — TROPONIN I

## 2017-03-04 MED ORDER — OMEPRAZOLE 20 MG PO CPDR: 20.0000 mg | DELAYED_RELEASE_CAPSULE | Freq: Two times a day (BID) | ORAL | 1 refills | Status: AC

## 2017-03-04 NOTE — ED Notes (Signed)
Pt called to nursing station, states b/p cuff is to tight. The cuff was inflated and twisted on pts are. I stopped the b/p cycle and took the cuff off. Pt c/o of burning to skin from b/p cuff, EDP Jeneen Rinks notified and examined pt. No new orders given at this time.

## 2017-03-04 NOTE — ED Notes (Signed)
As charge nurse, I spoke to pt concerning bruising on her RUE from BP cuff. Apologized to pt and assured pt that a safety zone would be placed and and it was documented in her chart. Pt had no other questions and expressed no other concerns.

## 2017-03-04 NOTE — Discharge Instructions (Signed)
See your PCP for follow up. Call cardiologist for follow up appointment.

## 2017-03-04 NOTE — ED Triage Notes (Signed)
C/o intermittent CP x 4 days-NAD-steady gait

## 2017-03-04 NOTE — ED Provider Notes (Signed)
[name] Provider Note   CSN: [id number] Arrival date & time: 03/04/17  1532     History   Chief Complaint Chief Complaint  Patient presents with  . Chest Pain    HPI Holly Perkins is a 71 y.o. female. CC: chest and abdominal pain.  HPI:  71 year old female. History of peripheral vascular disease and a left femoral stent. Also history of hypertension, high cholesterol, diabetes. No history of heart disease.  She describes episodes of chest pain starting this morning. She places her right hand on her epigastric area and pats it repetitively. She states "this is what it feels like". She states it feels like "short sudden jabs". Became "just sore" this afternoon. Presents here 2 hours after her pain resolved. No exertional pain. No diaphoresis. No dyspnea.   Patient also states that when she sits and then lays down she notices a bulge in her epigastrium. She was referred to a surgeon and told she did not have a hernia.  Past Medical History:  Diagnosis Date  . Chest pain, atypical   . Chronic renal failure    stage 3  . Diabetes mellitus without complication ([place])   . Diverticulitis   . High cholesterol   . Hyperlipidemia   . Hypertension   . Peripheral vascular disease ([place])     There are no active problems to display for this patient.   Past Surgical History:  Procedure Laterality Date  . ABDOMINAL HYSTERECTOMY    . FEMORAL ARTERY STENT      OB History    No data available       Home Medications    Prior to Admission medications   Medication Sig Start Date End Date Taking? Authorizing Provider  allopurinol (ZYLOPRIM) 100 MG tablet Take 100 mg by mouth daily.    [provider]  B Complex Vitamins (B-COMPLEX/B-12 PO) Take by mouth.      [provider]  benzonatate (TESSALON) 100 MG capsule Take 1 capsule (100 mg total) by mouth every 8 (eight) hours. 09/17/14   [name], MD  clopidogrel (PLAVIX) 75 MG tablet Take 75 mg  by mouth daily.      [provider]  ergocalciferol (VITAMIN D2) 50000 UNITS capsule Take 50,000 Units by mouth once a week.    [provider]  hydrochlorothiazide (HYDRODIURIL) 25 MG tablet Take 25 mg by mouth daily.      [provider]  HYDROcodone-acetaminophen (NORCO/VICODIN) 5-325 MG per tablet Take 1 tablet by mouth every 6 (six) hours as needed for pain.    [provider]  levofloxacin (LEVAQUIN) 500 MG tablet Take 500 mg by mouth daily.    [provider]  lisinopril (PRINIVIL,ZESTRIL) 40 MG tablet Take 40 mg by mouth daily.      [provider]  metFORMIN (GLUCOPHAGE) 500 MG tablet Take by mouth 2 (two) times daily with a meal.    [provider]  metroNIDAZOLE (FLAGYL) 500 MG tablet Take 500 mg by mouth 3 (three) times daily.    [provider]  omeprazole (PRILOSEC) 20 MG capsule Take 1 capsule (20 mg total) by mouth 2 (two) times daily. 03/04/17   Tanna Furry, MD  ondansetron (ZOFRAN ODT) 4 MG disintegrating tablet Take 1 tablet (4 mg total) by mouth every 8 (eight) hours as needed for nausea. 09/17/14   [name], MD  oxyCODONE (OXYCONTIN) 10 MG 12 hr tablet Take 10 mg by mouth every 4 (four) hours as needed.  [provider]  pravastatin (PRAVACHOL) 20 MG tablet Take 10 mg by mouth daily.     [provider]    Family History No family history on file.  Social History Social History  Substance Use Topics  . Smoking status: Former Smoker    Quit date: 09/08/2008  . Smokeless tobacco: Never Used  . Alcohol use No     Allergies   Patient has no known allergies.   Review of Systems Review of Systems  Constitutional: Negative for appetite change, chills, diaphoresis, fatigue and fever.  HENT: Negative for mouth sores, sore throat and trouble swallowing.   Eyes: Negative for visual disturbance.  Respiratory: Negative for cough, chest tightness, shortness of breath and wheezing.    Cardiovascular: Positive for chest pain.  Gastrointestinal: Positive for abdominal pain. Negative for abdominal distention, diarrhea, nausea and vomiting.  Endocrine: Negative for polydipsia, polyphagia and polyuria.  Genitourinary: Negative for dysuria, frequency and hematuria.  Musculoskeletal: Negative for gait problem.  Skin: Negative for color change, pallor and rash.  Neurological: Negative for dizziness, syncope, light-headedness and headaches.  Hematological: Does not bruise/bleed easily.  Psychiatric/Behavioral: Negative for behavioral problems and confusion.     Physical Exam Updated Vital Signs BP (!) 159/76   Pulse 69   Temp 98.4 F (36.9 C) (Oral)   Resp (!) 21   Ht 5\' 2"  (1.575 m)   Wt 124.3 kg (274 lb)   SpO2 100%   BMI 50.12 kg/m   Physical Exam  Constitutional: She is oriented to person, place, and time. She appears well-developed and well-nourished. No distress.  HENT:  Head: Normocephalic.  Eyes: Conjunctivae are normal. Pupils are equal, round, and reactive to light. No scleral icterus.  Neck: Normal range of motion. Neck supple. No thyromegaly present.  Cardiovascular: Normal rate and regular rhythm.  Exam reveals no gallop and no friction rub.   No murmur heard. Pulmonary/Chest: Effort normal and breath sounds normal. No respiratory distress. She has no wheezes. She has no rales.  Abdominal: Soft. Bowel sounds are normal. She exhibits no distension. There is no tenderness. There is no rebound.  Patient insists that she has a bolt in her abdomen that comes on when she sits and is noticeable for a few seconds before she lays down. I cannot appreciate this despite having her change position several times. No palpable ventral hernia.  Musculoskeletal: Normal range of motion.  Neurological: She is alert and oriented to person, place, and time.  Skin: Skin is warm and dry. No rash noted.  Psychiatric: She has a normal mood and affect. Her behavior is normal.       ED Treatments / Results  Labs (all labs ordered are listed, but only abnormal results are displayed) Labs Reviewed  CBC WITH DIFFERENTIAL/PLATELET - Abnormal; Notable for the following:       Result Value   Hemoglobin 11.2 (*)    HCT 33.5 (*)    RDW 17.5 (*)    All other components within normal limits  COMPREHENSIVE METABOLIC PANEL - Abnormal; Notable for the following:    Potassium 3.3 (*)    Creatinine, Ser 1.06 (*)    Calcium 8.6 (*)    Albumin 3.3 (*)    GFR calc non Af Amer 53 (*)    All other components within normal limits  LIPASE, BLOOD  TROPONIN I  TROPONIN I    EKG  EKG Interpretation  Date/Time:  Wednesday March 04 2017 15:42:45 EDT Ventricular Rate:  67  PR Interval:    QRS Duration: 95 QT Interval:  386 QTC Calculation: 408 R Axis:   39 Text Interpretation:  Sinus rhythm Atrial premature complexes Low voltage, extremity and precordial leads Baseline wander in lead(s) III Confirmed by Tanna Furry [phone]) on 03/04/2017 4:45:28 PM       Radiology Dg Chest 2 View  Result Date: 03/04/2017 CLINICAL DATA:  Retrosternal chest pain today an also four days ago. Morning headaches, dyspnea on exertion. Recently treated for bronchitis. EXAM: CHEST  2 VIEW COMPARISON:  Chest x-ray of September 17, 2014 FINDINGS: The lungs are adequately inflated. The interstitial markings are coarse but stable. The heart and pulmonary vascularity are normal. There is calcification in the wall of the aortic arch. There is no pleural effusion. There is mild multilevel degenerative disc disease of the thoracic spine. IMPRESSION: No acute pneumonia nor CHF. Chronic mild interstitial prominence consistent with the patient's smoking history. Thoracic aortic atherosclerosis. Electronically Signed   By: David  Martinique M.D.   On: 03/04/2017 16:25    Procedures Procedures (including critical care time)  Medications Ordered in ED Medications - No data to display   Initial Impression /  Assessment and Plan / ED Course  I have reviewed the triage vital signs and the nursing notes.  Pertinent labs & imaging results that were available during my care of the patient were reviewed by me and considered in my medical decision making (see chart for details).     EKG normal. Initial, and follow-up troponin normal. Normal exam. Her symptoms sound most consistent with reflux esophagitis that is positional. I cannot appreciate hernia on exam. I think is appropriate for discharge home. Primary care follow-up. Given cartilage or for referral. Trial of PPI.  Final Clinical Impressions(s) / ED Diagnoses   Final diagnoses:  Chest pain, unspecified type    New Prescriptions New Prescriptions   OMEPRAZOLE (PRILOSEC) 20 MG CAPSULE    Take 1 capsule (20 mg total) by mouth 2 (two) times daily.     Tanna Furry, MD 03/04/17 2033

## 2017-03-12 DIAGNOSIS — I739 Peripheral vascular disease, unspecified: Secondary | ICD-10-CM | POA: Diagnosis not present

## 2017-03-12 DIAGNOSIS — I70202 Unspecified atherosclerosis of native arteries of extremities, left leg: Secondary | ICD-10-CM | POA: Diagnosis not present

## 2017-03-17 DIAGNOSIS — K5730 Diverticulosis of large intestine without perforation or abscess without bleeding: Secondary | ICD-10-CM | POA: Diagnosis not present

## 2017-03-17 DIAGNOSIS — I70202 Unspecified atherosclerosis of native arteries of extremities, left leg: Secondary | ICD-10-CM | POA: Diagnosis not present

## 2017-03-17 DIAGNOSIS — I739 Peripheral vascular disease, unspecified: Secondary | ICD-10-CM | POA: Diagnosis not present

## 2017-03-24 DIAGNOSIS — I70202 Unspecified atherosclerosis of native arteries of extremities, left leg: Secondary | ICD-10-CM | POA: Diagnosis not present

## 2017-03-24 DIAGNOSIS — Z01810 Encounter for preprocedural cardiovascular examination: Secondary | ICD-10-CM | POA: Diagnosis not present

## 2017-04-02 DIAGNOSIS — I70202 Unspecified atherosclerosis of native arteries of extremities, left leg: Secondary | ICD-10-CM | POA: Diagnosis not present

## 2017-04-02 DIAGNOSIS — I70212 Atherosclerosis of native arteries of extremities with intermittent claudication, left leg: Secondary | ICD-10-CM | POA: Diagnosis not present

## 2017-04-09 DIAGNOSIS — I872 Venous insufficiency (chronic) (peripheral): Secondary | ICD-10-CM | POA: Diagnosis not present

## 2017-04-09 DIAGNOSIS — R609 Edema, unspecified: Secondary | ICD-10-CM | POA: Diagnosis not present

## 2017-04-09 DIAGNOSIS — I739 Peripheral vascular disease, unspecified: Secondary | ICD-10-CM | POA: Diagnosis not present

## 2017-04-09 DIAGNOSIS — I70212 Atherosclerosis of native arteries of extremities with intermittent claudication, left leg: Secondary | ICD-10-CM | POA: Diagnosis not present

## 2017-05-07 DIAGNOSIS — I70212 Atherosclerosis of native arteries of extremities with intermittent claudication, left leg: Secondary | ICD-10-CM | POA: Diagnosis not present

## 2017-05-07 DIAGNOSIS — R079 Chest pain, unspecified: Secondary | ICD-10-CM | POA: Diagnosis not present

## 2017-05-07 DIAGNOSIS — Z01810 Encounter for preprocedural cardiovascular examination: Secondary | ICD-10-CM | POA: Diagnosis not present

## 2017-05-14 DIAGNOSIS — I70212 Atherosclerosis of native arteries of extremities with intermittent claudication, left leg: Secondary | ICD-10-CM | POA: Diagnosis not present

## 2017-05-14 DIAGNOSIS — G8929 Other chronic pain: Secondary | ICD-10-CM | POA: Diagnosis not present

## 2017-05-14 DIAGNOSIS — M5441 Lumbago with sciatica, right side: Secondary | ICD-10-CM | POA: Diagnosis not present

## 2017-05-21 DIAGNOSIS — M545 Low back pain: Secondary | ICD-10-CM | POA: Diagnosis not present

## 2017-05-21 DIAGNOSIS — I70213 Atherosclerosis of native arteries of extremities with intermittent claudication, bilateral legs: Secondary | ICD-10-CM | POA: Diagnosis not present

## 2017-05-21 DIAGNOSIS — G8929 Other chronic pain: Secondary | ICD-10-CM | POA: Diagnosis not present

## 2017-05-21 DIAGNOSIS — I739 Peripheral vascular disease, unspecified: Secondary | ICD-10-CM | POA: Diagnosis not present

## 2017-06-10 DIAGNOSIS — E7800 Pure hypercholesterolemia, unspecified: Secondary | ICD-10-CM | POA: Diagnosis not present

## 2017-06-10 DIAGNOSIS — I6523 Occlusion and stenosis of bilateral carotid arteries: Secondary | ICD-10-CM | POA: Diagnosis not present

## 2017-06-10 DIAGNOSIS — E1151 Type 2 diabetes mellitus with diabetic peripheral angiopathy without gangrene: Secondary | ICD-10-CM | POA: Diagnosis not present

## 2017-06-10 DIAGNOSIS — I70239 Atherosclerosis of native arteries of right leg with ulceration of unspecified site: Secondary | ICD-10-CM | POA: Diagnosis not present

## 2017-06-10 DIAGNOSIS — I70211 Atherosclerosis of native arteries of extremities with intermittent claudication, right leg: Secondary | ICD-10-CM | POA: Diagnosis not present

## 2017-06-10 DIAGNOSIS — R600 Localized edema: Secondary | ICD-10-CM | POA: Diagnosis not present

## 2017-06-10 DIAGNOSIS — I10 Essential (primary) hypertension: Secondary | ICD-10-CM | POA: Diagnosis not present

## 2017-06-10 DIAGNOSIS — M545 Low back pain: Secondary | ICD-10-CM | POA: Diagnosis not present

## 2017-06-10 DIAGNOSIS — K219 Gastro-esophageal reflux disease without esophagitis: Secondary | ICD-10-CM | POA: Diagnosis not present

## 2017-06-10 DIAGNOSIS — G8929 Other chronic pain: Secondary | ICD-10-CM | POA: Diagnosis not present

## 2017-06-10 DIAGNOSIS — R269 Unspecified abnormalities of gait and mobility: Secondary | ICD-10-CM | POA: Diagnosis not present

## 2017-06-10 DIAGNOSIS — M79609 Pain in unspecified limb: Secondary | ICD-10-CM | POA: Diagnosis not present

## 2017-06-10 DIAGNOSIS — I872 Venous insufficiency (chronic) (peripheral): Secondary | ICD-10-CM | POA: Diagnosis not present

## 2017-06-23 DIAGNOSIS — Z23 Encounter for immunization: Secondary | ICD-10-CM | POA: Diagnosis not present

## 2017-06-23 DIAGNOSIS — E7800 Pure hypercholesterolemia, unspecified: Secondary | ICD-10-CM | POA: Diagnosis not present

## 2017-06-23 DIAGNOSIS — I10 Essential (primary) hypertension: Secondary | ICD-10-CM | POA: Diagnosis not present

## 2017-06-23 DIAGNOSIS — E1151 Type 2 diabetes mellitus with diabetic peripheral angiopathy without gangrene: Secondary | ICD-10-CM | POA: Diagnosis not present

## 2017-06-23 DIAGNOSIS — K219 Gastro-esophageal reflux disease without esophagitis: Secondary | ICD-10-CM | POA: Diagnosis not present

## 2017-07-02 DIAGNOSIS — I739 Peripheral vascular disease, unspecified: Secondary | ICD-10-CM | POA: Diagnosis not present

## 2017-07-02 DIAGNOSIS — I70211 Atherosclerosis of native arteries of extremities with intermittent claudication, right leg: Secondary | ICD-10-CM | POA: Diagnosis not present

## 2017-07-09 DIAGNOSIS — I739 Peripheral vascular disease, unspecified: Secondary | ICD-10-CM | POA: Diagnosis not present

## 2017-07-09 DIAGNOSIS — E119 Type 2 diabetes mellitus without complications: Secondary | ICD-10-CM | POA: Diagnosis not present

## 2017-07-09 DIAGNOSIS — E785 Hyperlipidemia, unspecified: Secondary | ICD-10-CM | POA: Diagnosis not present

## 2017-07-09 DIAGNOSIS — I10 Essential (primary) hypertension: Secondary | ICD-10-CM | POA: Diagnosis not present

## 2017-07-09 DIAGNOSIS — Z7902 Long term (current) use of antithrombotics/antiplatelets: Secondary | ICD-10-CM | POA: Diagnosis not present

## 2017-07-14 DIAGNOSIS — E119 Type 2 diabetes mellitus without complications: Secondary | ICD-10-CM | POA: Diagnosis not present

## 2017-07-14 DIAGNOSIS — I10 Essential (primary) hypertension: Secondary | ICD-10-CM | POA: Diagnosis not present

## 2017-07-14 DIAGNOSIS — E785 Hyperlipidemia, unspecified: Secondary | ICD-10-CM | POA: Diagnosis not present

## 2017-07-14 DIAGNOSIS — E559 Vitamin D deficiency, unspecified: Secondary | ICD-10-CM | POA: Diagnosis not present

## 2017-07-16 ENCOUNTER — Ambulatory Visit: Payer: Self-pay | Admitting: Podiatry

## 2017-08-07 DIAGNOSIS — I70211 Atherosclerosis of native arteries of extremities with intermittent claudication, right leg: Secondary | ICD-10-CM | POA: Diagnosis not present

## 2017-08-07 DIAGNOSIS — Z79899 Other long term (current) drug therapy: Secondary | ICD-10-CM | POA: Diagnosis not present

## 2017-08-07 DIAGNOSIS — E785 Hyperlipidemia, unspecified: Secondary | ICD-10-CM | POA: Diagnosis not present

## 2017-08-07 DIAGNOSIS — Z6841 Body Mass Index (BMI) 40.0 and over, adult: Secondary | ICD-10-CM | POA: Diagnosis not present

## 2017-08-07 DIAGNOSIS — Z87891 Personal history of nicotine dependence: Secondary | ICD-10-CM | POA: Diagnosis not present

## 2017-08-07 DIAGNOSIS — E7800 Pure hypercholesterolemia, unspecified: Secondary | ICD-10-CM | POA: Diagnosis not present

## 2017-08-07 DIAGNOSIS — I779 Disorder of arteries and arterioles, unspecified: Secondary | ICD-10-CM | POA: Diagnosis not present

## 2017-08-07 DIAGNOSIS — E1151 Type 2 diabetes mellitus with diabetic peripheral angiopathy without gangrene: Secondary | ICD-10-CM | POA: Diagnosis not present

## 2017-08-07 DIAGNOSIS — I10 Essential (primary) hypertension: Secondary | ICD-10-CM | POA: Diagnosis not present

## 2017-09-16 DIAGNOSIS — R911 Solitary pulmonary nodule: Secondary | ICD-10-CM | POA: Diagnosis not present

## 2017-09-16 DIAGNOSIS — I10 Essential (primary) hypertension: Secondary | ICD-10-CM | POA: Diagnosis not present

## 2017-09-16 DIAGNOSIS — R05 Cough: Secondary | ICD-10-CM | POA: Diagnosis not present

## 2017-09-16 DIAGNOSIS — R52 Pain, unspecified: Secondary | ICD-10-CM | POA: Diagnosis not present

## 2017-10-22 DIAGNOSIS — R911 Solitary pulmonary nodule: Secondary | ICD-10-CM | POA: Diagnosis not present

## 2017-10-22 DIAGNOSIS — R05 Cough: Secondary | ICD-10-CM | POA: Diagnosis not present

## 2017-11-26 DIAGNOSIS — I10 Essential (primary) hypertension: Secondary | ICD-10-CM | POA: Diagnosis not present

## 2017-11-26 DIAGNOSIS — I70213 Atherosclerosis of native arteries of extremities with intermittent claudication, bilateral legs: Secondary | ICD-10-CM | POA: Diagnosis not present

## 2017-11-26 DIAGNOSIS — E7800 Pure hypercholesterolemia, unspecified: Secondary | ICD-10-CM | POA: Diagnosis not present

## 2017-11-26 DIAGNOSIS — I739 Peripheral vascular disease, unspecified: Secondary | ICD-10-CM | POA: Diagnosis not present

## 2017-11-26 DIAGNOSIS — E1151 Type 2 diabetes mellitus with diabetic peripheral angiopathy without gangrene: Secondary | ICD-10-CM | POA: Diagnosis not present

## 2017-11-26 DIAGNOSIS — I70201 Unspecified atherosclerosis of native arteries of extremities, right leg: Secondary | ICD-10-CM | POA: Diagnosis not present

## 2017-11-26 DIAGNOSIS — I6523 Occlusion and stenosis of bilateral carotid arteries: Secondary | ICD-10-CM | POA: Diagnosis not present

## 2017-12-30 DIAGNOSIS — Z7984 Long term (current) use of oral hypoglycemic drugs: Secondary | ICD-10-CM | POA: Diagnosis not present

## 2017-12-30 DIAGNOSIS — E559 Vitamin D deficiency, unspecified: Secondary | ICD-10-CM | POA: Diagnosis not present

## 2017-12-30 DIAGNOSIS — E1151 Type 2 diabetes mellitus with diabetic peripheral angiopathy without gangrene: Secondary | ICD-10-CM | POA: Diagnosis not present

## 2017-12-30 DIAGNOSIS — Z1231 Encounter for screening mammogram for malignant neoplasm of breast: Secondary | ICD-10-CM | POA: Diagnosis not present

## 2017-12-30 DIAGNOSIS — Z6841 Body Mass Index (BMI) 40.0 and over, adult: Secondary | ICD-10-CM | POA: Diagnosis not present

## 2018-05-07 DIAGNOSIS — E785 Hyperlipidemia, unspecified: Secondary | ICD-10-CM | POA: Diagnosis not present

## 2018-05-07 DIAGNOSIS — E118 Type 2 diabetes mellitus with unspecified complications: Secondary | ICD-10-CM | POA: Diagnosis not present

## 2018-05-07 DIAGNOSIS — E559 Vitamin D deficiency, unspecified: Secondary | ICD-10-CM | POA: Diagnosis not present

## 2018-05-07 DIAGNOSIS — R5383 Other fatigue: Secondary | ICD-10-CM | POA: Diagnosis not present

## 2018-05-25 DIAGNOSIS — R6881 Early satiety: Secondary | ICD-10-CM | POA: Diagnosis not present

## 2018-05-25 DIAGNOSIS — N289 Disorder of kidney and ureter, unspecified: Secondary | ICD-10-CM | POA: Diagnosis not present

## 2018-05-25 DIAGNOSIS — E559 Vitamin D deficiency, unspecified: Secondary | ICD-10-CM | POA: Diagnosis not present

## 2018-05-25 DIAGNOSIS — R1010 Upper abdominal pain, unspecified: Secondary | ICD-10-CM | POA: Diagnosis not present

## 2018-06-17 DIAGNOSIS — E7800 Pure hypercholesterolemia, unspecified: Secondary | ICD-10-CM | POA: Diagnosis not present

## 2018-06-17 DIAGNOSIS — I70212 Atherosclerosis of native arteries of extremities with intermittent claudication, left leg: Secondary | ICD-10-CM | POA: Diagnosis not present

## 2018-06-17 DIAGNOSIS — E1151 Type 2 diabetes mellitus with diabetic peripheral angiopathy without gangrene: Secondary | ICD-10-CM | POA: Diagnosis not present

## 2018-06-17 DIAGNOSIS — I6523 Occlusion and stenosis of bilateral carotid arteries: Secondary | ICD-10-CM | POA: Diagnosis not present

## 2018-06-17 DIAGNOSIS — I70213 Atherosclerosis of native arteries of extremities with intermittent claudication, bilateral legs: Secondary | ICD-10-CM | POA: Diagnosis not present

## 2018-06-17 DIAGNOSIS — I10 Essential (primary) hypertension: Secondary | ICD-10-CM | POA: Diagnosis not present

## 2018-06-17 DIAGNOSIS — I70211 Atherosclerosis of native arteries of extremities with intermittent claudication, right leg: Secondary | ICD-10-CM | POA: Diagnosis not present

## 2018-06-17 DIAGNOSIS — I70291 Other atherosclerosis of native arteries of extremities, right leg: Secondary | ICD-10-CM | POA: Diagnosis not present

## 2018-08-12 DIAGNOSIS — Z6841 Body Mass Index (BMI) 40.0 and over, adult: Secondary | ICD-10-CM | POA: Diagnosis not present

## 2018-08-12 DIAGNOSIS — Z79899 Other long term (current) drug therapy: Secondary | ICD-10-CM | POA: Diagnosis not present

## 2018-08-12 DIAGNOSIS — E1151 Type 2 diabetes mellitus with diabetic peripheral angiopathy without gangrene: Secondary | ICD-10-CM | POA: Diagnosis not present

## 2018-08-12 DIAGNOSIS — R3911 Hesitancy of micturition: Secondary | ICD-10-CM | POA: Diagnosis not present

## 2018-08-12 DIAGNOSIS — Z87891 Personal history of nicotine dependence: Secondary | ICD-10-CM | POA: Diagnosis not present

## 2018-08-16 DIAGNOSIS — J45909 Unspecified asthma, uncomplicated: Secondary | ICD-10-CM | POA: Diagnosis not present

## 2018-08-16 DIAGNOSIS — R51 Headache: Secondary | ICD-10-CM | POA: Diagnosis not present

## 2018-08-16 DIAGNOSIS — J069 Acute upper respiratory infection, unspecified: Secondary | ICD-10-CM | POA: Diagnosis not present

## 2018-08-16 DIAGNOSIS — K219 Gastro-esophageal reflux disease without esophagitis: Secondary | ICD-10-CM | POA: Diagnosis not present

## 2018-08-30 ENCOUNTER — Encounter (HOSPITAL_BASED_OUTPATIENT_CLINIC_OR_DEPARTMENT_OTHER): Payer: Self-pay

## 2018-08-30 ENCOUNTER — Emergency Department (HOSPITAL_BASED_OUTPATIENT_CLINIC_OR_DEPARTMENT_OTHER)
Admission: EM | Admit: 2018-08-30 | Discharge: 2018-08-30 | Disposition: A | Discharge status: Home / Self Care | Payer: Medicare HMO | Attending: Emergency Medicine | Admitting: Emergency Medicine

## 2018-08-30 ENCOUNTER — Other Ambulatory Visit: Payer: Self-pay

## 2018-08-30 ENCOUNTER — Emergency Department (HOSPITAL_BASED_OUTPATIENT_CLINIC_OR_DEPARTMENT_OTHER): Payer: Medicare HMO

## 2018-08-30 DIAGNOSIS — I129 Hypertensive chronic kidney disease with stage 1 through stage 4 chronic kidney disease, or unspecified chronic kidney disease: Secondary | ICD-10-CM | POA: Insufficient documentation

## 2018-08-30 DIAGNOSIS — J40 Bronchitis, not specified as acute or chronic: Secondary | ICD-10-CM | POA: Diagnosis not present

## 2018-08-30 DIAGNOSIS — N183 Chronic kidney disease, stage 3 (moderate): Secondary | ICD-10-CM | POA: Insufficient documentation

## 2018-08-30 DIAGNOSIS — Z7984 Long term (current) use of oral hypoglycemic drugs: Secondary | ICD-10-CM | POA: Insufficient documentation

## 2018-08-30 DIAGNOSIS — E1122 Type 2 diabetes mellitus with diabetic chronic kidney disease: Secondary | ICD-10-CM | POA: Diagnosis not present

## 2018-08-30 DIAGNOSIS — Z79899 Other long term (current) drug therapy: Secondary | ICD-10-CM | POA: Diagnosis not present

## 2018-08-30 DIAGNOSIS — R0981 Nasal congestion: Secondary | ICD-10-CM | POA: Diagnosis not present

## 2018-08-30 DIAGNOSIS — Z87891 Personal history of nicotine dependence: Secondary | ICD-10-CM | POA: Diagnosis not present

## 2018-08-30 DIAGNOSIS — Z7902 Long term (current) use of antithrombotics/antiplatelets: Secondary | ICD-10-CM | POA: Insufficient documentation

## 2018-08-30 DIAGNOSIS — R05 Cough: Secondary | ICD-10-CM | POA: Diagnosis not present

## 2018-08-30 LAB — CBG MONITORING, ED: GLUCOSE-CAPILLARY: 78 mg/dL (ref 70–99)

## 2018-08-30 MED ORDER — PREDNISONE 10 MG (21) PO TBPK: ORAL_TABLET | Freq: Every day | ORAL | 0 refills | Status: AC

## 2018-08-30 MED ORDER — IPRATROPIUM-ALBUTEROL 0.5-2.5 (3) MG/3ML IN SOLN
3.0000 mL | Freq: Once | RESPIRATORY_TRACT | Status: AC
  Administered 2018-08-30: 3 mL via RESPIRATORY_TRACT
  Filled 2018-08-30: qty 3

## 2018-08-30 MED ORDER — ALBUTEROL SULFATE HFA 108 (90 BASE) MCG/ACT IN AERS
2.0000 | INHALATION_SPRAY | Freq: Once | RESPIRATORY_TRACT | Status: AC
  Administered 2018-08-30: 2 via RESPIRATORY_TRACT
  Filled 2018-08-30: qty 6.7

## 2018-08-30 MED ORDER — PREDNISONE 50 MG PO TABS
60.0000 mg | ORAL_TABLET | Freq: Once | ORAL | Status: AC
  Administered 2018-08-30: 60 mg via ORAL
  Filled 2018-08-30: qty 1

## 2018-08-30 MED FILL — predniSONE 10 MG TABS: 10 | 12 days supply | Qty: 42 | Fill #0

## 2018-08-30 NOTE — Progress Notes (Signed)
RT note: RT instructed patient with use of MDI with spacer using the teach back method. Patient displayed understanding and proper use.

## 2018-08-30 NOTE — ED Provider Notes (Signed)
[facility] EMERGENCY DEPARTMENT Provider Note   CSN: [id number] Arrival date & time: 08/30/18  1158     History   Chief Complaint Chief Complaint  Patient presents with  . Cough    HPI Holly Perkins is a 72 y.o. female with history of hypertension, hyperlipidemia, diabetes who presents with a one-month history of cough, nasal congestion, decreased appetite and taste, hoarseness, facial pressure.  Patient reports a fever a few weeks ago accompanied with vomiting and diarrhea, however this is resolved.  Patient denies any significant shortness of breath, but reports she had seconds of fleeting chest pain as recent as 1 week ago.  She has not had chest pain since then.  She denies any abdominal pain.  HPI  Past Medical History:  Diagnosis Date  . Chest pain, atypical   . Chronic renal failure    stage 3  . Diabetes mellitus without complication ([place])   . Diverticulitis   . High cholesterol   . Hyperlipidemia   . Hypertension   . Peripheral vascular disease ([place])     There are no active problems to display for this patient.   Past Surgical History:  Procedure Laterality Date  . ABDOMINAL HYSTERECTOMY    . FEMORAL ARTERY STENT       OB History   No obstetric history on file.      Home Medications    Prior to Admission medications   Medication Sig Start Date End Date Taking? Authorizing Provider  allopurinol (ZYLOPRIM) 100 MG tablet Take 100 mg by mouth daily.    [provider]  B Complex Vitamins (B-COMPLEX/B-12 PO) Take by mouth.      [provider]  benzonatate (TESSALON) 100 MG capsule Take 1 capsule (100 mg total) by mouth every 8 (eight) hours. 09/17/14   [name], MD  clopidogrel (PLAVIX) 75 MG tablet Take 75 mg by mouth daily.      [provider]  ergocalciferol (VITAMIN D2) 50000 UNITS capsule Take 50,000 Units by mouth once a week.    [provider]  hydrochlorothiazide (HYDRODIURIL) 25 MG tablet  Take 25 mg by mouth daily.      [provider]  HYDROcodone-acetaminophen (NORCO/VICODIN) 5-325 MG per tablet Take 1 tablet by mouth every 6 (six) hours as needed for pain.    [provider]  levofloxacin (LEVAQUIN) 500 MG tablet Take 500 mg by mouth daily.    [provider]  lisinopril (PRINIVIL,ZESTRIL) 40 MG tablet Take 40 mg by mouth daily.      [provider]  metFORMIN (GLUCOPHAGE) 500 MG tablet Take by mouth 2 (two) times daily with a meal.    [provider]  metroNIDAZOLE (FLAGYL) 500 MG tablet Take 500 mg by mouth 3 (three) times daily.    [provider]  omeprazole (PRILOSEC) 20 MG capsule Take 1 capsule (20 mg total) by mouth 2 (two) times daily. 03/04/17   Tanna Furry, MD  ondansetron (ZOFRAN ODT) 4 MG disintegrating tablet Take 1 tablet (4 mg total) by mouth every 8 (eight) hours as needed for nausea. 09/17/14   [name], MD  oxyCODONE (OXYCONTIN) 10 MG 12 hr tablet Take 10 mg by mouth every 4 (four) hours as needed.    [provider]  pravastatin (PRAVACHOL) 20 MG tablet Take 10 mg by mouth daily.     [provider]  predniSONE (STERAPRED UNI-PAK 21 TAB) 10 MG (21) TBPK tablet Take by mouth daily. Take 6 tabs  by mouth daily  for 2 days, then 5 tabs for 2 days, then 4 tabs for 2 days, then 3 tabs for 2 days, 2 tabs for 2 days, then 1 tab by mouth daily for 2 days 08/30/18   Frederica Kuster, PA-C    Family History No family history on file.  Social History Social History   Tobacco Use  . Smoking status: Former Smoker    Last attempt to quit: 09/08/2008    Years since quitting: 9.9  . Smokeless tobacco: Never Used  Substance Use Topics  . Alcohol use: No  . Drug use: No     Allergies   Patient has no known allergies.   Review of Systems Review of Systems  Constitutional: Positive for chills. Negative for fever.  HENT: Positive for congestion, ear pain, sinus pressure, sinus pain, sore  throat and voice change (hoarse). Negative for facial swelling.   Respiratory: Positive for cough. Negative for shortness of breath.   Cardiovascular: Positive for chest pain (1 week ago, sharp, fleeting).  Gastrointestinal: Negative for abdominal pain, nausea and vomiting.  Genitourinary: Negative for dysuria.  Musculoskeletal: Negative for back pain.  Skin: Negative for rash and wound.  Neurological: Negative for headaches.  Psychiatric/Behavioral: The patient is not nervous/anxious.      Physical Exam Updated Vital Signs BP (!) 121/55   Pulse (!) 56   Temp 97.9 F (36.6 C) (Oral)   Resp 12   Ht 5\' 2"  (1.575 m)   Wt 115.2 kg   SpO2 98%   BMI 46.46 kg/m   Physical Exam Vitals signs and nursing note reviewed.  Constitutional:      General: She is not in acute distress.    Appearance: She is well-developed. She is not diaphoretic.  HENT:     Head: Normocephalic and atraumatic.     Right Ear: Tympanic membrane normal.     Left Ear: Tympanic membrane normal.     Nose: Congestion present.     Mouth/Throat:     Pharynx: No oropharyngeal exudate.  Eyes:     General: No scleral icterus.       Right eye: No discharge.        Left eye: No discharge.     Conjunctiva/sclera: Conjunctivae normal.     Pupils: Pupils are equal, round, and reactive to light.  Neck:     Musculoskeletal: Normal range of motion and neck supple.     Thyroid: No thyromegaly.  Cardiovascular:     Rate and Rhythm: Normal rate and regular rhythm.     Heart sounds: Normal heart sounds. No murmur. No friction rub. No gallop.   Pulmonary:     Effort: Pulmonary effort is normal. No respiratory distress.     Breath sounds: No stridor. Decreased breath sounds (may be s/s to body habitus) present. No wheezing or rales.  Abdominal:     General: Bowel sounds are normal. There is no distension.     Palpations: Abdomen is soft.     Tenderness: There is no abdominal tenderness. There is no guarding or rebound.    Lymphadenopathy:     Cervical: No cervical adenopathy.  Skin:    General: Skin is warm and dry.     Coloration: Skin is not pale.     Findings: No rash.  Neurological:     Mental Status: She is alert.     Coordination: Coordination normal.      ED Treatments / Results  Labs (all labs  ordered are listed, but only abnormal results are displayed) Labs Reviewed  CBG MONITORING, ED    EKG None  Radiology Dg Chest 2 View  Result Date: 08/30/2018 CLINICAL DATA:  Cough x1 month EXAM: CHEST - 2 VIEW COMPARISON:  10/22/2017 FINDINGS: Lungs are clear.  No pleural effusion or pneumothorax. The heart is normal in size. Degenerative changes of the visualized thoracolumbar spine. IMPRESSION: Normal chest radiographs. Electronically Signed   By: Julian Hy M.D.   On: 08/30/2018 13:14    Procedures Procedures (including critical care time)  Medications Ordered in ED Medications  predniSONE (DELTASONE) tablet 60 mg (60 mg Oral Given 08/30/18 1502)  ipratropium-albuterol (DUONEB) 0.5-2.5 (3) MG/3ML nebulizer solution 3 mL (3 mLs Nebulization Given 08/30/18 1437)  albuterol (PROVENTIL HFA;VENTOLIN HFA) 108 (90 Base) MCG/ACT inhaler 2 puff (2 puffs Inhalation Given 08/30/18 1437)     Initial Impression / Assessment and Plan / ED Course  I have reviewed the triage vital signs and the nursing notes.  Pertinent labs & imaging results that were available during my care of the patient were reviewed by me and considered in my medical decision making (see chart for details).     Patient presenting with upper respiratory symptoms for the past few weeks.  Her chest x-ray is clear.  Suspect possible bronchitis.  Patient given a DuoNeb treatment in the ED with good relief.  Will discharge home with prednisone taper and albuterol inhaler.  Follow-up to PCP as needed.  Return precautions discussed.  Patient understands and agrees with plan.  Patient vital stable throughout ED course and  discharged in satisfactory condition.  Patient also evaluated by my attending, Dr. Kathrynn Humble, who guided the patient's management and agrees with plan.  Final Clinical Impressions(s) / ED Diagnoses   Final diagnoses:  Bronchitis    ED Discharge Orders         Ordered    predniSONE (STERAPRED UNI-PAK 21 TAB) 10 MG (21) TBPK tablet  Daily     08/30/18 1522           Frederica Kuster, PA-C 08/30/18 1802    Varney Biles, MD 09/04/18 1012

## 2018-08-30 NOTE — ED Notes (Signed)
Patient transported to X-ray 

## 2018-08-30 NOTE — ED Triage Notes (Signed)
C/o flu like sx x 4 weeks-NAD-steady gait

## 2018-08-30 NOTE — Discharge Instructions (Signed)
Take prednisone until completed.  Use albuterol inhaler every 4-6 hours as needed for shortness of breath or wheezing.  Please follow-up with your doctor early next week for recheck.  Please return the emergency department develop any new or worsening symptoms.

## 2018-09-14 DIAGNOSIS — E1169 Type 2 diabetes mellitus with other specified complication: Secondary | ICD-10-CM | POA: Diagnosis not present

## 2018-09-14 DIAGNOSIS — E1151 Type 2 diabetes mellitus with diabetic peripheral angiopathy without gangrene: Secondary | ICD-10-CM | POA: Diagnosis not present

## 2018-09-14 DIAGNOSIS — K219 Gastro-esophageal reflux disease without esophagitis: Secondary | ICD-10-CM | POA: Diagnosis not present

## 2018-09-14 DIAGNOSIS — J989 Respiratory disorder, unspecified: Secondary | ICD-10-CM | POA: Diagnosis not present

## 2018-09-14 DIAGNOSIS — Z6841 Body Mass Index (BMI) 40.0 and over, adult: Secondary | ICD-10-CM | POA: Diagnosis not present

## 2018-09-30 DIAGNOSIS — E118 Type 2 diabetes mellitus with unspecified complications: Secondary | ICD-10-CM | POA: Diagnosis not present

## 2018-09-30 DIAGNOSIS — Z6841 Body Mass Index (BMI) 40.0 and over, adult: Secondary | ICD-10-CM | POA: Diagnosis not present

## 2018-09-30 DIAGNOSIS — E1151 Type 2 diabetes mellitus with diabetic peripheral angiopathy without gangrene: Secondary | ICD-10-CM | POA: Diagnosis not present

## 2018-09-30 DIAGNOSIS — J45909 Unspecified asthma, uncomplicated: Secondary | ICD-10-CM | POA: Diagnosis not present

## 2018-09-30 DIAGNOSIS — K219 Gastro-esophageal reflux disease without esophagitis: Secondary | ICD-10-CM | POA: Diagnosis not present

## 2018-09-30 DIAGNOSIS — Z87891 Personal history of nicotine dependence: Secondary | ICD-10-CM | POA: Diagnosis not present

## 2018-09-30 DIAGNOSIS — R079 Chest pain, unspecified: Secondary | ICD-10-CM | POA: Diagnosis not present

## 2019-06-26 ENCOUNTER — Other Ambulatory Visit: Payer: Self-pay

## 2019-06-26 ENCOUNTER — Emergency Department (HOSPITAL_BASED_OUTPATIENT_CLINIC_OR_DEPARTMENT_OTHER): Payer: Medicare Other

## 2019-06-26 ENCOUNTER — Encounter (HOSPITAL_BASED_OUTPATIENT_CLINIC_OR_DEPARTMENT_OTHER): Payer: Self-pay | Admitting: *Deleted

## 2019-06-26 ENCOUNTER — Emergency Department (HOSPITAL_BASED_OUTPATIENT_CLINIC_OR_DEPARTMENT_OTHER)
Admission: EM | Admit: 2019-06-26 | Discharge: 2019-06-26 | Disposition: A | Discharge status: Home / Self Care | Payer: Medicare Other | Attending: Emergency Medicine | Admitting: Emergency Medicine

## 2019-06-26 DIAGNOSIS — N183 Chronic kidney disease, stage 3 unspecified: Secondary | ICD-10-CM | POA: Diagnosis not present

## 2019-06-26 DIAGNOSIS — E1122 Type 2 diabetes mellitus with diabetic chronic kidney disease: Secondary | ICD-10-CM | POA: Insufficient documentation

## 2019-06-26 DIAGNOSIS — R0602 Shortness of breath: Secondary | ICD-10-CM | POA: Insufficient documentation

## 2019-06-26 DIAGNOSIS — R1012 Left upper quadrant pain: Secondary | ICD-10-CM | POA: Insufficient documentation

## 2019-06-26 DIAGNOSIS — Z79899 Other long term (current) drug therapy: Secondary | ICD-10-CM | POA: Diagnosis not present

## 2019-06-26 DIAGNOSIS — R109 Unspecified abdominal pain: Secondary | ICD-10-CM

## 2019-06-26 DIAGNOSIS — Z87891 Personal history of nicotine dependence: Secondary | ICD-10-CM | POA: Insufficient documentation

## 2019-06-26 DIAGNOSIS — Z7984 Long term (current) use of oral hypoglycemic drugs: Secondary | ICD-10-CM | POA: Diagnosis not present

## 2019-06-26 DIAGNOSIS — I129 Hypertensive chronic kidney disease with stage 1 through stage 4 chronic kidney disease, or unspecified chronic kidney disease: Secondary | ICD-10-CM | POA: Insufficient documentation

## 2019-06-26 LAB — D-DIMER, QUANTITATIVE: D-Dimer, Quant: 9.67 ug/mL-FEU — ABNORMAL HIGH (ref 0.00–0.50)

## 2019-06-26 LAB — CBC WITH DIFFERENTIAL/PLATELET
Abs Immature Granulocytes: 0.02 10*3/uL (ref 0.00–0.07)
Basophils Absolute: 0.1 10*3/uL (ref 0.0–0.1)
Basophils Relative: 1 %
Eosinophils Absolute: 0.2 10*3/uL (ref 0.0–0.5)
Eosinophils Relative: 2 %
HCT: 36 % (ref 36.0–46.0)
Hemoglobin: 11.3 g/dL — ABNORMAL LOW (ref 12.0–15.0)
Immature Granulocytes: 0 %
Lymphocytes Relative: 26 %
Lymphs Abs: 2.3 10*3/uL (ref 0.7–4.0)
MCH: 26.7 pg (ref 26.0–34.0)
MCHC: 31.4 g/dL (ref 30.0–36.0)
MCV: 84.9 fL (ref 80.0–100.0)
Monocytes Absolute: 0.5 10*3/uL (ref 0.1–1.0)
Monocytes Relative: 6 %
Neutro Abs: 5.7 10*3/uL (ref 1.7–7.7)
Neutrophils Relative %: 65 %
Platelets: 272 10*3/uL (ref 150–400)
RBC: 4.24 MIL/uL (ref 3.87–5.11)
RDW: 15.9 % — ABNORMAL HIGH (ref 11.5–15.5)
WBC: 8.7 10*3/uL (ref 4.0–10.5)
nRBC: 0 % (ref 0.0–0.2)

## 2019-06-26 LAB — URINALYSIS, ROUTINE W REFLEX MICROSCOPIC
Bilirubin Urine: NEGATIVE
Glucose, UA: NEGATIVE mg/dL
Hgb urine dipstick: NEGATIVE
Ketones, ur: NEGATIVE mg/dL
Leukocytes,Ua: NEGATIVE
Nitrite: NEGATIVE
Protein, ur: NEGATIVE mg/dL
Specific Gravity, Urine: 1.025 (ref 1.005–1.030)
pH: 5.5 (ref 5.0–8.0)

## 2019-06-26 LAB — BASIC METABOLIC PANEL
Anion gap: 12 (ref 5–15)
BUN: 26 mg/dL — ABNORMAL HIGH (ref 8–23)
CO2: 21 mmol/L — ABNORMAL LOW (ref 22–32)
Calcium: 9.5 mg/dL (ref 8.9–10.3)
Chloride: 104 mmol/L (ref 98–111)
Creatinine, Ser: 1.29 mg/dL — ABNORMAL HIGH (ref 0.44–1.00)
GFR calc Af Amer: 48 mL/min — ABNORMAL LOW (ref >60)
GFR calc non Af Amer: 42 mL/min — ABNORMAL LOW (ref >60)
Glucose, Bld: 102 mg/dL — ABNORMAL HIGH (ref 70–99)
Potassium: 3.5 mmol/L (ref 3.5–5.1)
Sodium: 137 mmol/L (ref 135–145)

## 2019-06-26 MED ORDER — LIDOCAINE 5 % EX PTCH: 1.0000 | MEDICATED_PATCH | CUTANEOUS | 0 refills | Status: DC

## 2019-06-26 MED ORDER — MORPHINE SULFATE (PF) 4 MG/ML IV SOLN
4.0000 mg | Freq: Once | INTRAVENOUS | Status: AC
  Administered 2019-06-26: 4 mg via INTRAVENOUS
  Filled 2019-06-26: qty 1

## 2019-06-26 MED ORDER — IOHEXOL 350 MG/ML SOLN
100.0000 mL | Freq: Once | INTRAVENOUS | Status: AC | PRN
  Administered 2019-06-26: 19:00:00 100 mL via INTRAVENOUS

## 2019-06-26 MED ORDER — HYDROCODONE-ACETAMINOPHEN 5-325 MG PO TABS
1.0000 | ORAL_TABLET | Freq: Once | ORAL | Status: AC
  Administered 2019-06-26: 22:00:00 1 via ORAL
  Filled 2019-06-26: qty 1

## 2019-06-26 MED ORDER — SODIUM CHLORIDE 0.9 % IV BOLUS
1000.0000 mL | Freq: Once | INTRAVENOUS | Status: AC
  Administered 2019-06-26: 16:00:00 1000 mL via INTRAVENOUS

## 2019-06-26 MED ORDER — HYDROCODONE-ACETAMINOPHEN 5-325 MG PO TABS: 1.0000 | ORAL_TABLET | Freq: Four times a day (QID) | ORAL | 0 refills | Status: AC | PRN

## 2019-06-26 MED ORDER — ONDANSETRON HCL 4 MG/2ML IJ SOLN
4.0000 mg | Freq: Once | INTRAMUSCULAR | Status: AC
  Administered 2019-06-26: 16:00:00 4 mg via INTRAVENOUS
  Filled 2019-06-26: qty 2

## 2019-06-26 NOTE — ED Notes (Signed)
MD at bedside to attempt IV

## 2019-06-26 NOTE — Discharge Instructions (Addendum)
Your CT today showed today did not show evidence of blood clot that is contributing to your symptoms.  This may be musculoskeletal in nature.  Please follow up with you primary care physician in 1 week for reevaluation of symptoms.  You can take 1000 mg of Tylenol.  Do not exceed 4000 mg of Tylenol a day.  Take pain medications as directed for break through pain. Do not drive or operate machinery while taking this medication.  You should not take the pain medication while you are home alone as this may make you susceptible to falling.  Use Lidoderm patch as directed.  Return the emergency department any worsening pain, chest pain, difficulty breathing or any other worsening or concerning symptoms.

## 2019-06-26 NOTE — ED Provider Notes (Signed)
[facility] EMERGENCY DEPARTMENT Provider Note   CSN: [id number] Arrival date & time: 06/26/19  1418     History   Chief Complaint Chief Complaint  Patient presents with   Flank Pain    HPI [name] is a 73 y.o. female.     73 y.o female with a PMH of DM, CKD, HTN presents to the ED with a chief complaint of left flank pain x yesterday. Patient describes an intermittent sharp pain to her left back with radiation into her LUQ. She reports the pain wax and wanes but is worse with deep inspiration. She also endorses some shortness of breath, states this began yesterday exacerbated by the pain.  She also endorses one episode of urin urgency this week, states this resolved.  She has not taken any medication for relieve in symptoms. She denies any alleviating factors. She denies any nausea, vomiting, prior history of stone disease. She denies any chest pain, according to records has missed three appointments for cardiology follow up. She denies any dizziness, diarrhea, or weakness.   The history is provided by the patient and medical records.  Flank Pain Pertinent negatives include no chest pain, no abdominal pain, no headaches and no shortness of breath.    Past Medical History:  Diagnosis Date   Chest pain, atypical    Chronic renal failure    stage 3   Diabetes mellitus without complication ([facility])    Diverticulitis    High cholesterol    Hyperlipidemia    Hypertension    Peripheral vascular disease ([place])     There are no active problems to display for this patient.   Past Surgical History:  Procedure Laterality Date   ABDOMINAL HYSTERECTOMY     FEMORAL ARTERY STENT       OB History   No obstetric history on file.      Home Medications    Prior to Admission medications   Medication Sig Start Date End Date Taking? Authorizing Provider  allopurinol (ZYLOPRIM) 100 MG tablet Take 100 mg by mouth daily.    [provider]  B  Complex Vitamins (B-COMPLEX/B-12 PO) Take by mouth.      [provider]  benzonatate (TESSALON) 100 MG capsule Take 1 capsule (100 mg total) by mouth every 8 (eight) hours. 09/17/14   [name], MD  clopidogrel (PLAVIX) 75 MG tablet Take 75 mg by mouth daily.      [provider]  ergocalciferol (VITAMIN D2) 50000 UNITS capsule Take 50,000 Units by mouth once a week.    [provider]  hydrochlorothiazide (HYDRODIURIL) 25 MG tablet Take 25 mg by mouth daily.      [provider]  HYDROcodone-acetaminophen (NORCO/VICODIN) 5-325 MG per tablet Take 1 tablet by mouth every 6 (six) hours as needed for pain.    [provider]  levofloxacin (LEVAQUIN) 500 MG tablet Take 500 mg by mouth daily.    [provider]  lisinopril (PRINIVIL,ZESTRIL) 40 MG tablet Take 40 mg by mouth daily.      [provider]  metFORMIN (GLUCOPHAGE) 500 MG tablet Take by mouth 2 (two) times daily with a meal.    [provider]  metroNIDAZOLE (FLAGYL) 500 MG tablet Take 500 mg by mouth 3 (three) times daily.    [provider]  omeprazole (PRILOSEC) 20 MG capsule Take 1 capsule (20 mg total) by mouth 2 (two) times daily. 03/04/17   Tanna Furry, MD  ondansetron (ZOFRAN ODT) 4  MG disintegrating tablet Take 1 tablet (4 mg total) by mouth every 8 (eight) hours as needed for nausea. 09/17/14   [name], MD  oxyCODONE (OXYCONTIN) 10 MG 12 hr tablet Take 10 mg by mouth every 4 (four) hours as needed.    [provider]  pravastatin (PRAVACHOL) 20 MG tablet Take 10 mg by mouth daily.     [provider]  predniSONE (STERAPRED UNI-PAK 21 TAB) 10 MG (21) TBPK tablet Take by mouth daily. Take 6 tabs by mouth daily  for 2 days, then 5 tabs for 2 days, then 4 tabs for 2 days, then 3 tabs for 2 days, 2 tabs for 2 days, then 1 tab by mouth daily for 2 days 08/30/18   Frederica Kuster, PA-C    Family History History reviewed. No pertinent  family history.  Social History Social History   Tobacco Use   Smoking status: Former Smoker    Quit date: 09/08/2008    Years since quitting: 10.8   Smokeless tobacco: Never Used  Substance Use Topics   Alcohol use: No   Drug use: No     Allergies   Patient has no known allergies.   Review of Systems Review of Systems  Constitutional: Negative for fever.  HENT: Negative for sneezing and sore throat.   Respiratory: Negative for shortness of breath.   Cardiovascular: Negative for chest pain.  Gastrointestinal: Negative for abdominal pain, diarrhea, nausea and vomiting.  Genitourinary: Positive for flank pain and urgency. Negative for difficulty urinating, hematuria, vaginal bleeding and vaginal discharge.  Musculoskeletal: Negative for back pain and myalgias.  Skin: Negative for pallor and wound.  Neurological: Negative for dizziness, seizures, syncope, light-headedness and headaches.     Physical Exam Updated Vital Signs BP (!) 124/113 (BP Location: Left Arm) Comment: pt continues to move arm during BP inflation   Pulse (!) 53    Temp 97.8 F (36.6 C) (Oral)    Resp 18    Ht 5\' 2"  (1.575 m)    Wt 116.6 kg    SpO2 100%    BMI 47.01 kg/m   Physical Exam Vitals signs and nursing note reviewed.  Constitutional:      General: She is not in acute distress.    Appearance: She is well-developed. She is not ill-appearing.     Comments: Appears uncomfortable.   HENT:     Head: Normocephalic and atraumatic.     Mouth/Throat:     Pharynx: No oropharyngeal exudate.  Eyes:     Pupils: Pupils are equal, round, and reactive to light.  Neck:     Musculoskeletal: Normal range of motion.  Cardiovascular:     Rate and Rhythm: Regular rhythm.     Heart sounds: Normal heart sounds.  Pulmonary:     Effort: Pulmonary effort is normal. No respiratory distress.     Breath sounds: Normal breath sounds.     Comments: Lungs difficult to auscultate due to body habitus.  Abdominal:      General: Bowel sounds are normal. There is no distension.     Palpations: Abdomen is soft.     Tenderness: There is abdominal tenderness in the left upper quadrant. There is no right CVA tenderness or left CVA tenderness. Negative signs include McBurney's sign.       Comments: Significant tenderness to palpation. Bowel sounds are unremarkable.   Musculoskeletal:        General: No tenderness or deformity.     Thoracic back:  She exhibits no pain.       Back:       Arms:     Right lower leg: No edema.     Left lower leg: No edema.  Skin:    General: Skin is warm and dry.  Neurological:     Mental Status: She is alert and oriented to person, place, and time.      ED Treatments / Results  Labs (all labs ordered are listed, but only abnormal results are displayed) Labs Reviewed  CBC WITH DIFFERENTIAL/PLATELET - Abnormal; Notable for the following components:      Result Value   Hemoglobin 11.3 (*)    RDW 15.9 (*)    All other components within normal limits  BASIC METABOLIC PANEL - Abnormal; Notable for the following components:   CO2 21 (*)    Glucose, Bld 102 (*)    BUN 26 (*)    Creatinine, Ser 1.29 (*)    GFR calc non Af Amer 42 (*)    GFR calc Af Amer 48 (*)    All other components within normal limits  URINALYSIS, ROUTINE W REFLEX MICROSCOPIC - Abnormal; Notable for the following components:   APPearance CLOUDY (*)    All other components within normal limits  D-DIMER, QUANTITATIVE (NOT AT [facility]) - Abnormal; Notable for the following components:   D-Dimer, Quant 9.67 (*)    All other components within normal limits  URINE CULTURE    EKG EKG Interpretation  Date/Time:  Sunday June 26 2019 14:29:51 EDT Ventricular Rate:  44 PR Interval:  166 QRS Duration: 86 QT Interval:  452 QTC Calculation: 386 R Axis:   45 Text Interpretation:  Marked sinus bradycardia Low voltage QRS Abnormal ECG Rate slower since prior 6/18 Confirmed by Aletta Edouard [phone]) on  06/26/2019 2:32:17 PM   Radiology Dg Chest Portable 1 View  Result Date: 06/26/2019 CLINICAL DATA:  Shortness of breath EXAM: PORTABLE CHEST 1 VIEW COMPARISON:  03/18/2019 FINDINGS: Poor penetration secondary to patient body habitus. The heart size and mediastinal contours are within normal limits. No focal airspace consolidation, pleural effusion, or pneumothorax. The visualized skeletal structures are unremarkable. IMPRESSION: No acute cardiopulmonary findings. Electronically Signed   By: Davina Poke M.D.   On: 06/26/2019 15:26   Ct Renal Stone Study  Result Date: 06/26/2019 CLINICAL DATA:  74 year old female with history of left-sided flank pain. Suspected kidney stone. EXAM: CT ABDOMEN AND PELVIS WITHOUT CONTRAST TECHNIQUE: Multidetector CT imaging of the abdomen and pelvis was performed following the standard protocol without IV contrast. COMPARISON:  CT the abdomen and pelvis 01/28/2016. FINDINGS: Lower chest: Atherosclerotic calcifications in the descending thoracic aorta as well as the left main, left anterior descending, left circumflex and right coronary arteries. Hepatobiliary: No definite suspicious cystic or solid hepatic lesions are confidently identified on today's noncontrast CT examination. Unenhanced appearance of the gallbladder is normal. Pancreas: No definite pancreatic mass or peripancreatic fluid collections or inflammatory changes are noted on today's noncontrast CT examination. Spleen: Unremarkable. Adrenals/Urinary Tract: There are no abnormal calcifications within the collecting system of either kidney, along the course of either ureter, or within the lumen of the urinary bladder. No hydroureteronephrosis or perinephric stranding to suggest urinary tract obstruction at this time. The unenhanced appearance of the kidneys is unremarkable bilaterally. Unenhanced appearance of the urinary bladder is normal in appearance. Bilateral adrenal glands are normal in appearance.  Stomach/Bowel: Normal appearance of the stomach. No pathologic dilatation of small bowel or colon. A  few scattered colonic diverticulae are noted, particularly in the sigmoid colon, without surrounding inflammatory changes to suggest an acute diverticulitis at this time. Normal appendix. Vascular/Lymphatic: Aortic atherosclerosis. No lymphadenopathy noted in the abdomen or pelvis. Reproductive: Status post hysterectomy. Ovaries are not confidently identified may be surgically absent or atrophic. Other: No significant volume of ascites.  No pneumoperitoneum. Musculoskeletal: There are no aggressive appearing lytic or blastic lesions noted in the visualized portions of the skeleton. IMPRESSION: 1. No acute findings are noted in the abdomen or pelvis to account for the patient's symptoms. No urinary tract calculi no findings of urinary tract obstruction are noted at this time. 2. Colonic diverticulosis without evidence of acute diverticulitis at this time. 3. Aortic atherosclerosis, in addition to left main and 3 vessel coronary artery disease. Assessment for potential risk factor modification, dietary therapy or pharmacologic therapy may be warranted, if clinically indicated. Electronically Signed   By: Vinnie Langton M.D.   On: 06/26/2019 15:36    Procedures Procedures (including critical care time)  Medications Ordered in ED Medications  morphine 4 MG/ML injection 4 mg (has no administration in time range)  sodium chloride 0.9 % bolus 1,000 mL ( Intravenous Stopped 06/26/19 1722)  morphine 4 MG/ML injection 4 mg (4 mg Intravenous Given 06/26/19 1623)  ondansetron (ZOFRAN) injection 4 mg (4 mg Intravenous Given 06/26/19 1619)     Initial Impression / Assessment and Plan / ED Course  I have reviewed the triage vital signs and the nursing notes.  Pertinent labs & imaging results that were available during my care of the patient were reviewed by me and considered in my medical decision making (see  chart for details).  Clinical Course as of Jun 25 1748  Sun Jun 25, 2657  7558 73 year old female with multiple medical problems here with left flank pain that started this morning.  She denies any trauma.  Is associated with some shortness of breath.  No urinary symptoms.  She said she had a similar pain a few weeks ago but went away quickly and she did not end up getting checked out.  She is getting lab work chest x-ray and a CT KUB.  Disposition per results of testing.   [MB]    Clinical Course User Index [MB] [name], MD      Patient with an extensive PMH including CKD, HTN, DM presents to the ED with a chief plane of left flank pain that began yesterday.  She reports the pain is worse so along the left upper quadrant, tends to worsen with taking a deep breath.  She does not have any prior history of blood clots.  She reports no prior history of kidney stones.  She had one episode of urinary urgency, this self resolved this past week.  During my evaluation patient appears uncomfortable, she continues to grab her left upper quadrant, states she is unable to take a deep breath.  She is currently not on any supplemental oxygen, does not have a prior history of afib.  BMP showed no electrolyte derangement, current level slightly elevated, she does have a history of renal disease but this is increased from her previous visit.  CBC was without any leukocytosis, hemoglobin slightly decreased but within baseline.  UA without any nitrites or leukocytes, she reports no hematuria or using the restroom today. Chest x-ray was obtained in order to further evaluate any fractures, pleural effusions.  Her chest x-ray did not show any acute finding, no consolidation or pneumothorax.  Denies any trauma.  CT renal showed: 1. No acute findings are noted in the abdomen or pelvis to account  for the patient's symptoms. No urinary tract calculi no findings of  urinary tract obstruction are noted at this  time.  2. Colonic diverticulosis without evidence of acute diverticulitis  at this time.  3. Aortic atherosclerosis, in addition to left main and 3 vessel  coronary artery disease. Assessment for potential risk factor  modification, dietary therapy or pharmacologic therapy may be  warranted, if clinically indicated.     5:41 PM I have discussed further plan with patient after a positive d-dimer at 9  She reports the pain is mainly located around her lower left lung field, right in between her left flank.  She reports this pain somewhat radiates to her chest but she does not have any chest pain at this time.  Discussed obtaining a CT chest to rule out any pulmonary embolism.  Patient is agreeable of this therapy of note, patient is currently on Plavix for poor perfusion to her legs after stents, she reports compliance with this medication. Nursing staff at the bedside attempting ultrasound-guided IV.  Her oxygenation remains at 100% on room air, no tachypnea.  Patient care signed out to [facility] at shift change pending CT results. Will dispo according to results. Suspect if normal and she is ambulating without hypoxia can be dispo home.     Portions of this note were generated with [employer]. Dictation errors may occur despite best attempts at proofreading.  Final Clinical Impressions(s) / ED Diagnoses   Final diagnoses:  Left flank pain    ED Discharge Orders    None       Janeece Fitting, PA-C 06/26/19 1749    [name], MD 06/27/19 [phone]

## 2019-06-26 NOTE — ED Notes (Signed)
Provider at the bedside.  

## 2019-06-26 NOTE — ED Triage Notes (Signed)
P talking on cell phone during triage. Reports left flank pain-increased with deep inspiration that started this morning. Ambulatory, no acute distress noted.

## 2019-06-26 NOTE — ED Provider Notes (Signed)
Care assumed from [facility], [place] at shift change with CTA chest pending.  In brief, this patient is a 73 year old female with possible history of diabetes, CKD, hypertension who presents for evaluation of left flank pain that began yesterday.  Reports intermittent sharp pain from her left back that radiates into her left upper quadrant.  She states that this pain is not constant but is intermittent and feels like it is worse with deep inspiration.  Also endorses some new shortness of breath.  Please see note from previous provider for full history/physical exam.   Physical Exam  BP (!) 116/52 (BP Location: Right Arm)   Pulse (!) 52   Temp 97.8 F (36.6 C) (Oral)   Resp 16   Ht 5\' 2"  (1.575 m)   Wt 116.6 kg   SpO2 100%   BMI 47.01 kg/m   Physical Exam   Abdomen is soft, nondistended.  No tenderness palpation noted. Mild tenderness palpation noted to left lateral chest wall that is worse with palpation of the area.  No overlying rash.  ED Course/Procedures   Clinical Course as of Jun 25 1914  Sun Jun 25, 8568  [id number] 73 year old female with multiple medical problems here with left flank pain that started this morning.  She denies any trauma.  Is associated with some shortness of breath.  No urinary symptoms.  She said she had a similar pain a few weeks ago but went away quickly and she did not end up getting checked out.  She is getting lab work chest x-ray and a CT KUB.  Disposition per results of testing.   [MB]    Clinical Course User Index [MB] [name], MD    Procedures  MDM   PLAN: Patient pending CTA of chest for evaluation of PE after elevated D-dimer.  If CTA of chest is negative, plan to discharge home.  MDM:  D-dimer was elevated.  BMP showed BUN and creatinine 26, 1.29 respectively.  CBC without any significant leukocytosis or anemia.  UA negative for any infectious etiology.  Per previous provider, patient was not any chest pain and exam is not  concerning for cardiac etiology.  CTA of chest showed no evidence of acute PE.  Discussed results with patient.  She reports feeling better after pain medications.  She is also mild tenderness noted to the right lateral chest wall.  Will give short course of pain medication.  Instructed patient to follow-up with her primary care doctor for further evaluation.  On my evaluation, did not see any rash to indicate shingles but discussed with patient that if she has any worsening symptoms, she will need to return to the ED. Discussed patient with Dr. Tamera Punt who is agreeable. Patient had ample opportunity for questions and discussion. All patient's questions were answered with full understanding. Strict return precautions discussed. Patient expresses understanding and agreement to plan.   [name]  Results for orders placed or performed during the hospital encounter of 06/26/19 (from the past 24 hour(s))  CBC with Differential     Status: Abnormal   Collection Time: 06/26/19  3:24 PM  Result Value Ref Range   WBC 8.7 4.0 - 10.5 K/uL   RBC 4.24 3.87 - 5.11 MIL/uL   Hemoglobin 11.3 (L) 12.0 - 15.0 g/dL   HCT 36.0 36.0 - 46.0 %   MCV 84.9 80.0 - 100.0 fL   MCH 26.7 26.0 - 34.0 pg   MCHC 31.4 30.0 - 36.0 g/dL   RDW 15.9 (  H) 11.5 - 15.5 %   Platelets 272 150 - 400 K/uL   nRBC 0.0 0.0 - 0.2 %   Neutrophils Relative % 65 %   Neutro Abs 5.7 1.7 - 7.7 K/uL   Lymphocytes Relative 26 %   Lymphs Abs 2.3 0.7 - 4.0 K/uL   Monocytes Relative 6 %   Monocytes Absolute 0.5 0.1 - 1.0 K/uL   Eosinophils Relative 2 %   Eosinophils Absolute 0.2 0.0 - 0.5 K/uL   Basophils Relative 1 %   Basophils Absolute 0.1 0.0 - 0.1 K/uL   Immature Granulocytes 0 %   Abs Immature Granulocytes 0.02 0.00 - 0.07 K/uL  Basic metabolic panel     Status: Abnormal   Collection Time: 06/26/19  3:24 PM  Result Value Ref Range   Sodium 137 135 - 145 mmol/L   Potassium 3.5 3.5 - 5.1 mmol/L   Chloride 104 98 - 111 mmol/L   CO2  21 (L) 22 - 32 mmol/L   Glucose, Bld 102 (H) 70 - 99 mg/dL   BUN 26 (H) 8 - 23 mg/dL   Creatinine, Ser 1.29 (H) 0.44 - 1.00 mg/dL   Calcium 9.5 8.9 - 10.3 mg/dL   GFR calc non Af Amer 42 (L) >60 mL/min   GFR calc Af Amer 48 (L) >60 mL/min   Anion gap 12 5 - 15  Urinalysis, Routine w reflex microscopic     Status: Abnormal   Collection Time: 06/26/19  3:25 PM  Result Value Ref Range   Color, Urine YELLOW YELLOW   APPearance CLOUDY (A) CLEAR   Specific Gravity, Urine 1.025 1.005 - 1.030   pH 5.5 5.0 - 8.0   Glucose, UA NEGATIVE NEGATIVE mg/dL   Hgb urine dipstick NEGATIVE NEGATIVE   Bilirubin Urine NEGATIVE NEGATIVE   Ketones, ur NEGATIVE NEGATIVE mg/dL   Protein, ur NEGATIVE NEGATIVE mg/dL   Nitrite NEGATIVE NEGATIVE   Leukocytes,Ua NEGATIVE NEGATIVE  D-dimer, quantitative (not at [facility])     Status: Abnormal   Collection Time: 06/26/19  4:10 PM  Result Value Ref Range   D-Dimer, Quant 9.67 (H) 0.00 - 0.50 ug/mL-FEU     1. Left flank pain     1. Left flank pain      Holly Perkins 06/27/19 0101    [name], MD 07/05/19 [phone]

## 2019-06-26 NOTE — ED Notes (Signed)
Patient transported to CT 

## 2019-06-26 NOTE — ED Notes (Addendum)
Ginger ale given per pt request. PA states pt can drink fluids. Pt also requesting pain medications. Pt with flushing to her cheeks bilateral. Pt states she has not had this before Will speak to PA about this. Denies any itching or sob. No other red areas noted.

## 2019-06-26 NOTE — ED Notes (Signed)
[place] IV attempted by Maretta Bees, RN; unable to obtain; EDP (PA and MD made aware.

## 2019-06-26 NOTE — ED Notes (Signed)
Patient transported to X-ray 

## 2019-06-26 NOTE — ED Notes (Signed)
Checked on progress of 20g IV; RN unable to get IV;   RN will notify CT when they are able to 20g IV

## 2019-06-27 LAB — URINE CULTURE

## 2020-01-01 ENCOUNTER — Emergency Department (HOSPITAL_BASED_OUTPATIENT_CLINIC_OR_DEPARTMENT_OTHER): Payer: Medicare Other

## 2020-01-01 ENCOUNTER — Emergency Department (HOSPITAL_BASED_OUTPATIENT_CLINIC_OR_DEPARTMENT_OTHER)
Admission: EM | Admit: 2020-01-01 | Discharge: 2020-01-02 | Disposition: A | Discharge status: Home / Self Care | Payer: Medicare Other | Attending: Emergency Medicine | Admitting: Emergency Medicine

## 2020-01-01 ENCOUNTER — Other Ambulatory Visit: Payer: Self-pay

## 2020-01-01 ENCOUNTER — Encounter (HOSPITAL_BASED_OUTPATIENT_CLINIC_OR_DEPARTMENT_OTHER): Payer: Self-pay | Admitting: Emergency Medicine

## 2020-01-01 DIAGNOSIS — Z79899 Other long term (current) drug therapy: Secondary | ICD-10-CM | POA: Diagnosis not present

## 2020-01-01 DIAGNOSIS — E1122 Type 2 diabetes mellitus with diabetic chronic kidney disease: Secondary | ICD-10-CM | POA: Insufficient documentation

## 2020-01-01 DIAGNOSIS — I129 Hypertensive chronic kidney disease with stage 1 through stage 4 chronic kidney disease, or unspecified chronic kidney disease: Secondary | ICD-10-CM | POA: Insufficient documentation

## 2020-01-01 DIAGNOSIS — N183 Chronic kidney disease, stage 3 unspecified: Secondary | ICD-10-CM | POA: Insufficient documentation

## 2020-01-01 DIAGNOSIS — R609 Edema, unspecified: Secondary | ICD-10-CM

## 2020-01-01 DIAGNOSIS — R600 Localized edema: Secondary | ICD-10-CM | POA: Diagnosis not present

## 2020-01-01 DIAGNOSIS — R0602 Shortness of breath: Secondary | ICD-10-CM | POA: Diagnosis not present

## 2020-01-01 DIAGNOSIS — Z87891 Personal history of nicotine dependence: Secondary | ICD-10-CM | POA: Insufficient documentation

## 2020-01-01 DIAGNOSIS — Z7902 Long term (current) use of antithrombotics/antiplatelets: Secondary | ICD-10-CM | POA: Insufficient documentation

## 2020-01-01 LAB — CBC WITH DIFFERENTIAL/PLATELET
Abs Immature Granulocytes: 0.08 10*3/uL — ABNORMAL HIGH (ref 0.00–0.07)
Basophils Absolute: 0.1 10*3/uL (ref 0.0–0.1)
Basophils Relative: 0 %
Eosinophils Absolute: 0.2 10*3/uL (ref 0.0–0.5)
Eosinophils Relative: 2 %
HCT: 32.5 % — ABNORMAL LOW (ref 36.0–46.0)
Hemoglobin: 10.4 g/dL — ABNORMAL LOW (ref 12.0–15.0)
Immature Granulocytes: 1 %
Lymphocytes Relative: 21 %
Lymphs Abs: 3 10*3/uL (ref 0.7–4.0)
MCH: 26.1 pg (ref 26.0–34.0)
MCHC: 32 g/dL (ref 30.0–36.0)
MCV: 81.5 fL (ref 80.0–100.0)
Monocytes Absolute: 0.9 10*3/uL (ref 0.1–1.0)
Monocytes Relative: 6 %
Neutro Abs: 9.8 10*3/uL — ABNORMAL HIGH (ref 1.7–7.7)
Neutrophils Relative %: 70 %
Platelets: 267 10*3/uL (ref 150–400)
RBC: 3.99 MIL/uL (ref 3.87–5.11)
RDW: 17.7 % — ABNORMAL HIGH (ref 11.5–15.5)
WBC: 14.1 10*3/uL — ABNORMAL HIGH (ref 4.0–10.5)
nRBC: 0 % (ref 0.0–0.2)

## 2020-01-01 LAB — COMPREHENSIVE METABOLIC PANEL
ALT: 16 U/L (ref 0–44)
AST: 18 U/L (ref 15–41)
Albumin: 3.7 g/dL (ref 3.5–5.0)
Alkaline Phosphatase: 55 U/L (ref 38–126)
Anion gap: 9 (ref 5–15)
BUN: 36 mg/dL — ABNORMAL HIGH (ref 8–23)
CO2: 21 mmol/L — ABNORMAL LOW (ref 22–32)
Calcium: 9.7 mg/dL (ref 8.9–10.3)
Chloride: 107 mmol/L (ref 98–111)
Creatinine, Ser: 1.4 mg/dL — ABNORMAL HIGH (ref 0.44–1.00)
GFR calc Af Amer: 44 mL/min — ABNORMAL LOW (ref >60)
GFR calc non Af Amer: 38 mL/min — ABNORMAL LOW (ref >60)
Glucose, Bld: 102 mg/dL — ABNORMAL HIGH (ref 70–99)
Potassium: 4.1 mmol/L (ref 3.5–5.1)
Sodium: 137 mmol/L (ref 135–145)
Total Bilirubin: 0.5 mg/dL (ref 0.3–1.2)
Total Protein: 7.4 g/dL (ref 6.5–8.1)

## 2020-01-01 LAB — BRAIN NATRIURETIC PEPTIDE: B Natriuretic Peptide: 65.8 pg/mL (ref 0.0–100.0)

## 2020-01-01 MED ORDER — HYDROCODONE-ACETAMINOPHEN 5-325 MG PO TABS
1.0000 | ORAL_TABLET | Freq: Once | ORAL | Status: AC
  Administered 2020-01-01: 1 via ORAL
  Filled 2020-01-01: qty 1

## 2020-01-01 MED ORDER — FUROSEMIDE 10 MG/ML IJ SOLN
40.0000 mg | Freq: Once | INTRAMUSCULAR | Status: AC
  Administered 2020-01-02: 40 mg via INTRAVENOUS
  Filled 2020-01-01: qty 4

## 2020-01-01 NOTE — ED Triage Notes (Signed)
Reports BIL leg swelling x 2 weeks. States they are red, "hot", and painful. Pt states she has been on "fluid pills" x 3 weeks without relief. Also reports SHOB on exertion. Oxygen saturation at triage 100%.

## 2020-01-02 ENCOUNTER — Encounter (HOSPITAL_BASED_OUTPATIENT_CLINIC_OR_DEPARTMENT_OTHER): Payer: Self-pay

## 2020-01-02 ENCOUNTER — Emergency Department (HOSPITAL_BASED_OUTPATIENT_CLINIC_OR_DEPARTMENT_OTHER): Payer: Medicare Other

## 2020-01-02 LAB — D-DIMER, QUANTITATIVE: D-Dimer, Quant: 1.16 ug/mL-FEU — ABNORMAL HIGH (ref 0.00–0.50)

## 2020-01-02 LAB — TROPONIN I (HIGH SENSITIVITY)
Troponin I (High Sensitivity): 7 ng/L (ref <18)
Troponin I (High Sensitivity): 7 ng/L (ref <18)

## 2020-01-02 MED ORDER — IOHEXOL 350 MG/ML SOLN
80.0000 mL | Freq: Once | INTRAVENOUS | Status: AC | PRN
  Administered 2020-01-02: 80 mL via INTRAVENOUS

## 2020-01-02 MED ORDER — OXYCODONE-ACETAMINOPHEN 5-325 MG PO TABS
1.0000 | ORAL_TABLET | Freq: Once | ORAL | Status: AC
  Administered 2020-01-02: 1 via ORAL
  Filled 2020-01-02: qty 1

## 2020-01-02 MED ORDER — FUROSEMIDE 40 MG PO TABS: 40.0000 mg | ORAL_TABLET | Freq: Every day | ORAL | 0 refills | Status: AC

## 2020-01-02 NOTE — ED Notes (Signed)
Assisted pt with getting dressed. States her questions had been answered by the MD. Pt was given crackers and a drink with pain medication. Voiced understanding of d/c instructions. Assisted pt to car in wheelchair.

## 2020-01-02 NOTE — ED Notes (Signed)
Ambulated in hallway while checking O2 sat.  Tolerated well.  Sat remained at 99-100% throughout walk.  Patient does have chronic pain that makes walking difficult.

## 2020-01-02 NOTE — ED Notes (Signed)
In to d/c patient and family is very concerned about her pain level and asking about an admission. I asked Dr. Dina Rich to speak with pt.

## 2020-01-02 NOTE — ED Provider Notes (Addendum)
[name] Provider Note   CSN: [id number] Arrival date & time: 01/01/20  1952     History Chief Complaint  Patient presents with  . Leg Swelling    Holly Perkins is a 74 y.o. female.  HPI     This is a 74 year old female with a history of diabetes, hypertension, hyperlipidemia who presents with lower extremity swelling.  Patient reports 3-week history of worsening lower extremity swelling.  She reports that she has been on "a fluid pill" with no relief.  She states that her primary physician switched her from 1 fluid pill to another but has had no change.  She reports 5 out of 10 pain in her lower extremities secondary to fluid retention.  She also reports dyspnea on exertion.  She denies orthopnea.  She has no known history of heart failure.  She denies chest pain, fever, cough.  Patient's chart reviewed.  Stress echo on 10/13/2018 with normal EF and low risk without segmental changes.  Performed at [facility].  Past Medical History:  Diagnosis Date  . Chest pain, atypical   . Chronic renal failure    stage 3  . Diabetes mellitus without complication ([place])   . Diverticulitis   . High cholesterol   . Hyperlipidemia   . Hypertension   . Peripheral vascular disease ([place])     There are no problems to display for this patient.   Past Surgical History:  Procedure Laterality Date  . ABDOMINAL HYSTERECTOMY    . FEMORAL ARTERY STENT       OB History   No obstetric history on file.     No family history on file.  Social History   Tobacco Use  . Smoking status: Former Smoker    Quit date: 09/08/2008    Years since quitting: 11.3  . Smokeless tobacco: Never Used  Substance Use Topics  . Alcohol use: No  . Drug use: No    Home Medications Prior to Admission medications   Medication Sig Start Date End Date Taking? Authorizing Provider  allopurinol (ZYLOPRIM) 100 MG tablet Take 100 mg by mouth daily.    [provider]  B  Complex Vitamins (B-COMPLEX/B-12 PO) Take by mouth.      [provider]  benzonatate (TESSALON) 100 MG capsule Take 1 capsule (100 mg total) by mouth every 8 (eight) hours. 09/17/14   [name], MD  clopidogrel (PLAVIX) 75 MG tablet Take 75 mg by mouth daily.      [provider]  ergocalciferol (VITAMIN D2) 50000 UNITS capsule Take 50,000 Units by mouth once a week.    [provider]  furosemide (LASIX) 40 MG tablet Take 1 tablet (40 mg total) by mouth daily. 01/02/20   Cass Edinger, Barbette Hair, MD  hydrochlorothiazide (HYDRODIURIL) 25 MG tablet Take 25 mg by mouth daily.      [provider]  HYDROcodone-acetaminophen (NORCO/VICODIN) 5-325 MG tablet Take 1-2 tablets by mouth every 6 (six) hours as needed. 06/26/19   Volanda Napoleon, PA-C  levofloxacin (LEVAQUIN) 500 MG tablet Take 500 mg by mouth daily.    [provider]  lidocaine (LIDODERM) 5 % Place 1 patch onto the skin daily. Remove & Discard patch within 12 hours or as directed by MD 06/26/19   Volanda Napoleon, PA-C  lisinopril (PRINIVIL,ZESTRIL) 40 MG tablet Take 40 mg by mouth daily.      [provider]  metFORMIN (GLUCOPHAGE) 500 MG tablet Take by mouth 2 (  two) times daily with a meal.    [provider]  metroNIDAZOLE (FLAGYL) 500 MG tablet Take 500 mg by mouth 3 (three) times daily.    [provider]  omeprazole (PRILOSEC) 20 MG capsule Take 1 capsule (20 mg total) by mouth 2 (two) times daily. 03/04/17   Tanna Furry, MD  ondansetron (ZOFRAN ODT) 4 MG disintegrating tablet Take 1 tablet (4 mg total) by mouth every 8 (eight) hours as needed for nausea. 09/17/14   [name], MD  oxyCODONE (OXYCONTIN) 10 MG 12 hr tablet Take 10 mg by mouth every 4 (four) hours as needed.    [provider]  pravastatin (PRAVACHOL) 20 MG tablet Take 10 mg by mouth daily.     [provider]  predniSONE (STERAPRED UNI-PAK 21 TAB) 10 MG (21) TBPK tablet Take by  mouth daily. Take 6 tabs by mouth daily  for 2 days, then 5 tabs for 2 days, then 4 tabs for 2 days, then 3 tabs for 2 days, 2 tabs for 2 days, then 1 tab by mouth daily for 2 days 08/30/18   Frederica Kuster, PA-C    Allergies    Patient has no known allergies.  Review of Systems   Review of Systems  Constitutional: Negative for fever.  Respiratory: Positive for shortness of breath. Negative for cough.   Cardiovascular: Positive for leg swelling. Negative for chest pain.  Gastrointestinal: Negative for abdominal pain, nausea and vomiting.  Genitourinary: Negative for dysuria.  Musculoskeletal:       Bilateral leg pain  All other systems reviewed and are negative.   Physical Exam Updated Vital Signs BP 107/68 (BP Location: Left Arm)   Pulse 77   Temp 98.8 F (37.1 C) (Oral)   Resp 18   Ht 1.575 m (5\' 2" )   Wt 123.4 kg   SpO2 100%   BMI 49.75 kg/m   Physical Exam Vitals and nursing note reviewed.  Constitutional:      Appearance: She is well-developed. She is obese. She is not ill-appearing.  HENT:     Head: Normocephalic and atraumatic.     Nose: Nose normal.     Mouth/Throat:     Mouth: Mucous membranes are moist.  Eyes:     Pupils: Pupils are equal, round, and reactive to light.  Cardiovascular:     Rate and Rhythm: Normal rate and regular rhythm.     Heart sounds: Normal heart sounds.  Pulmonary:     Effort: Pulmonary effort is normal. No respiratory distress.     Breath sounds: No wheezing.  Abdominal:     Palpations: Abdomen is soft.     Tenderness: There is no abdominal tenderness.  Musculoskeletal:     Cervical back: Neck supple.     Right lower leg: Edema present.     Left lower leg: Edema present.     Comments: Symmetric bilateral lower extremity edema, 3+ pitting to the knees  Skin:    General: Skin is warm and dry.     Comments: No warmth or erythema to the lower extremities  Neurological:     Mental Status: She is alert and oriented to person,  place, and time.  Psychiatric:        Mood and Affect: Mood normal.     ED Results / Procedures / Treatments   Labs (all labs ordered are listed, but only abnormal results are displayed) Labs Reviewed  COMPREHENSIVE METABOLIC PANEL - Abnormal; Notable for the following  components:      Result Value   CO2 21 (*)    Glucose, Bld 102 (*)    BUN 36 (*)    Creatinine, Ser 1.40 (*)    GFR calc non Af Amer 38 (*)    GFR calc Af Amer 44 (*)    All other components within normal limits  CBC WITH DIFFERENTIAL/PLATELET - Abnormal; Notable for the following components:   WBC 14.1 (*)    Hemoglobin 10.4 (*)    HCT 32.5 (*)    RDW 17.7 (*)    Neutro Abs 9.8 (*)    Abs Immature Granulocytes 0.08 (*)    All other components within normal limits  D-DIMER, QUANTITATIVE (NOT AT [facility]) - Abnormal; Notable for the following components:   D-Dimer, Quant 1.16 (*)    All other components within normal limits  BRAIN NATRIURETIC PEPTIDE  TROPONIN I (HIGH SENSITIVITY)  TROPONIN I (HIGH SENSITIVITY)    EKG None  Radiology DG Chest 2 View  Result Date: 01/01/2020 CLINICAL DATA:  74 year old female with shortness of breath. EXAM: CHEST - 2 VIEW COMPARISON:  Chest radiograph dated 06/26/2019. FINDINGS: No focal consolidation, pleural effusion, pneumothorax. The cardiac silhouette is within limits. Atherosclerotic calcification of the aorta. Degenerative changes of the spine. No acute osseous pathology. IMPRESSION: No active cardiopulmonary disease. Electronically Signed   By: Anner Crete M.D.   On: 01/01/2020 22:32   CT Angio Chest PE W and/or Wo Contrast  Result Date: 01/02/2020 CLINICAL DATA:  Bilateral leg swelling x2 weeks. EXAM: CT ANGIOGRAPHY CHEST WITH CONTRAST TECHNIQUE: Multidetector CT imaging of the chest was performed using the standard protocol during bolus administration of intravenous contrast. Multiplanar CT image reconstructions and MIPs were obtained to evaluate the vascular  anatomy. CONTRAST:  69mL OMNIPAQUE IOHEXOL 350 MG/ML SOLN COMPARISON:  June 26, 2019 FINDINGS: Cardiovascular: Satisfactory opacification of the pulmonary arteries to the segmental level. No evidence of pulmonary embolism. Normal heart size. No pericardial effusion. There is marked severity coronary artery calcification. Mediastinum/Nodes: No enlarged mediastinal, hilar, or axillary lymph nodes. Thyroid gland, trachea, and esophagus demonstrate no significant findings. Lungs/Pleura: Lungs are clear. No pleural effusion or pneumothorax. Upper Abdomen: No acute abnormality. Musculoskeletal: No chest wall abnormality. No acute or significant osseous findings. Review of the MIP images confirms the above findings. IMPRESSION: 1. Negative examination for pulmonary embolism. 2. Marked severity coronary artery calcification. Electronically Signed   By: Virgina Norfolk M.D.   On: 01/02/2020 02:52    Procedures Procedures (including critical care time)  Medications Ordered in ED Medications  HYDROcodone-acetaminophen (NORCO/VICODIN) 5-325 MG per tablet 1 tablet (1 tablet Oral Given 01/01/20 2344)  furosemide (LASIX) injection 40 mg (40 mg Intravenous Given 01/02/20 0038)  iohexol (OMNIPAQUE) 350 MG/ML injection 80 mL (80 mLs Intravenous Contrast Given 01/02/20 0232)    ED Course  I have reviewed the triage vital signs and the nursing notes.  Pertinent labs & imaging results that were available during my care of the patient were reviewed by me and considered in my medical decision making (see chart for details).    MDM Rules/Calculators/A&P                       Patient presents with peripheral edema.  She has pitting edema to the knees.  She is overall nontoxic and vital signs are reassuring.  She is afebrile and satting 100% on room air.  She does report dyspnea on exertion but no orthopnea.  Basic lab work obtained.  Given pain in the lower extremities, D-dimer was obtained as well although  clinically it presents more as peripheral edema.  Heart failure is also consideration.  Chest x-ray shows no evidence of significant pulmonary edema.  CMP with slightly elevated creatinine at 1.4.  Potassium is normal.  Patient was given 40 mg of Lasix.  EKG is without ischemic changes.  Troponin x2 is negative.  Doubt ACS.  D-dimer did come back slightly elevated.  Will obtain CT of the chest to rule out PE.  CT chest is negative for PE but does show coronary artery calcifications.  I feel that it is very important that she have a functional test as well as an echocardiogram given her ongoing swelling and shortness of breath.  I have ruled out acute emergent processes in the meantime.  I stressed the importance of follow-up with the patient and her daughter.  Will place on Lasix for the next 5 days and instructed her not to take her spironolactone.    After history, exam, and medical workup I feel the patient has been appropriately medically screened and is safe for discharge home. Pertinent diagnoses were discussed with the patient. Patient was given return precautions.  Final Clinical Impression(s) / ED Diagnoses Final diagnoses:  Peripheral edema  SOB (shortness of breath)    Rx / DC Orders ED Discharge Orders         Ordered    furosemide (LASIX) 40 MG tablet  Daily     01/02/20 0404           Merryl Hacker, MD 01/02/20 [id number]    Merryl Hacker, MD 01/02/20 [phone]

## 2020-01-02 NOTE — Discharge Instructions (Addendum)
You were seen today for peripheral edema and shortness of breath.  It is very important that you follow-up with cardiology either in [facility] or call your primary doctor for cardiology follow-up.  You need a stress test and echocardiogram.  You will be started on Lasix for the next 5 days.  During this time do not take spironolactone.  Make sure that you are eating enough potassium.  If you develop any new or worsening symptoms, you should be reevaluated.

## 2020-01-02 NOTE — ED Notes (Signed)
MD with pt  

## 2020-08-23 ENCOUNTER — Emergency Department (HOSPITAL_BASED_OUTPATIENT_CLINIC_OR_DEPARTMENT_OTHER): Payer: Medicare Other

## 2020-08-23 ENCOUNTER — Encounter (HOSPITAL_BASED_OUTPATIENT_CLINIC_OR_DEPARTMENT_OTHER): Payer: Self-pay | Admitting: *Deleted

## 2020-08-23 ENCOUNTER — Emergency Department (HOSPITAL_BASED_OUTPATIENT_CLINIC_OR_DEPARTMENT_OTHER)
Admission: EM | Admit: 2020-08-23 | Discharge: 2020-08-23 | Disposition: A | Discharge status: Home / Self Care | Payer: Medicare Other | Attending: Emergency Medicine | Admitting: Emergency Medicine

## 2020-08-23 ENCOUNTER — Other Ambulatory Visit: Payer: Self-pay

## 2020-08-23 DIAGNOSIS — I130 Hypertensive heart and chronic kidney disease with heart failure and stage 1 through stage 4 chronic kidney disease, or unspecified chronic kidney disease: Secondary | ICD-10-CM | POA: Insufficient documentation

## 2020-08-23 DIAGNOSIS — R079 Chest pain, unspecified: Secondary | ICD-10-CM | POA: Diagnosis present

## 2020-08-23 DIAGNOSIS — N1831 Chronic kidney disease, stage 3a: Secondary | ICD-10-CM | POA: Insufficient documentation

## 2020-08-23 DIAGNOSIS — E1169 Type 2 diabetes mellitus with other specified complication: Secondary | ICD-10-CM | POA: Insufficient documentation

## 2020-08-23 DIAGNOSIS — I509 Heart failure, unspecified: Secondary | ICD-10-CM | POA: Diagnosis not present

## 2020-08-23 DIAGNOSIS — Z7984 Long term (current) use of oral hypoglycemic drugs: Secondary | ICD-10-CM | POA: Diagnosis not present

## 2020-08-23 DIAGNOSIS — E1151 Type 2 diabetes mellitus with diabetic peripheral angiopathy without gangrene: Secondary | ICD-10-CM | POA: Insufficient documentation

## 2020-08-23 DIAGNOSIS — E785 Hyperlipidemia, unspecified: Secondary | ICD-10-CM | POA: Diagnosis not present

## 2020-08-23 DIAGNOSIS — E1122 Type 2 diabetes mellitus with diabetic chronic kidney disease: Secondary | ICD-10-CM | POA: Diagnosis not present

## 2020-08-23 DIAGNOSIS — Z87891 Personal history of nicotine dependence: Secondary | ICD-10-CM | POA: Insufficient documentation

## 2020-08-23 DIAGNOSIS — Z20822 Contact with and (suspected) exposure to covid-19: Secondary | ICD-10-CM | POA: Insufficient documentation

## 2020-08-23 LAB — CBC
HCT: 36.5 % (ref 36.0–46.0)
Hemoglobin: 11.6 g/dL — ABNORMAL LOW (ref 12.0–15.0)
MCH: 26.4 pg (ref 26.0–34.0)
MCHC: 31.8 g/dL (ref 30.0–36.0)
MCV: 83 fL (ref 80.0–100.0)
Platelets: 263 10*3/uL (ref 150–400)
RBC: 4.4 MIL/uL (ref 3.87–5.11)
RDW: 16 % — ABNORMAL HIGH (ref 11.5–15.5)
WBC: 12 10*3/uL — ABNORMAL HIGH (ref 4.0–10.5)
nRBC: 0 % (ref 0.0–0.2)

## 2020-08-23 LAB — TROPONIN I (HIGH SENSITIVITY)
Troponin I (High Sensitivity): 10 ng/L (ref <18)
Troponin I (High Sensitivity): 11 ng/L (ref <18)

## 2020-08-23 LAB — RESP PANEL BY RT-PCR (FLU A&B, COVID) ARPGX2
Influenza A by PCR: NEGATIVE
Influenza B by PCR: NEGATIVE
SARS Coronavirus 2 by RT PCR: NEGATIVE

## 2020-08-23 LAB — BASIC METABOLIC PANEL
Anion gap: 12 (ref 5–15)
BUN: 26 mg/dL — ABNORMAL HIGH (ref 8–23)
CO2: 25 mmol/L (ref 22–32)
Calcium: 9.1 mg/dL (ref 8.9–10.3)
Chloride: 102 mmol/L (ref 98–111)
Creatinine, Ser: 1.21 mg/dL — ABNORMAL HIGH (ref 0.44–1.00)
GFR, Estimated: 48 mL/min — ABNORMAL LOW (ref >60)
Glucose, Bld: 92 mg/dL (ref 70–99)
Potassium: 3.5 mmol/L (ref 3.5–5.1)
Sodium: 139 mmol/L (ref 135–145)

## 2020-08-23 LAB — BRAIN NATRIURETIC PEPTIDE: B Natriuretic Peptide: 334.6 pg/mL — ABNORMAL HIGH (ref 0.0–100.0)

## 2020-08-23 MED ORDER — FUROSEMIDE 10 MG/ML IJ SOLN
40.0000 mg | Freq: Once | INTRAMUSCULAR | Status: AC
  Administered 2020-08-23: 40 mg via INTRAVENOUS
  Filled 2020-08-23: qty 4

## 2020-08-23 MED ORDER — ALBUTEROL SULFATE HFA 108 (90 BASE) MCG/ACT IN AERS
2.0000 | INHALATION_SPRAY | Freq: Once | RESPIRATORY_TRACT | Status: AC
  Administered 2020-08-23: 2 via RESPIRATORY_TRACT
  Filled 2020-08-23: qty 6.7

## 2020-08-23 MED ORDER — POTASSIUM CHLORIDE CRYS ER 20 MEQ PO TBCR
40.0000 meq | EXTENDED_RELEASE_TABLET | Freq: Once | ORAL | Status: AC
  Administered 2020-08-23: 40 meq via ORAL
  Filled 2020-08-23: qty 2

## 2020-08-23 NOTE — ED Notes (Signed)
In to assess pt and place on cont cardiac monitoring, noted pt to be using own inhaler.

## 2020-08-23 NOTE — ED Notes (Signed)
ED Provider at bedside. 

## 2020-08-23 NOTE — Discharge Instructions (Addendum)
If you have ANY worsening of your shortness of breath, you have ANY episodes of chest pain, please return immediately to ER for reassessment.    Please take an extra dose of your Lasix (40 mg, 1 tablet) in early evening for the next 2 to 3 days.  Tomorrow morning, please call your primary doctor office to schedule close follow-up appointment to be reevaluated and have your blood work rechecked; ideally, you should be seen by Monday or Tuesday of next week.

## 2020-08-23 NOTE — ED Provider Notes (Signed)
[facility]ERGENCY DEPARTMENT Provider Note   CSN: [id number] Arrival date & time: 08/23/20  1418     History Chief Complaint  Patient presents with  . Chest Pain    Holly Perkins is a 74 y.o. female.  Presents to ER with concern for chest pain.  States had an episode last night, occurred at rest, up to 10 out of 10 in severity, central pain, nonradiating.  No aggravating or alleviating factors, resolved on its own.  Then this morning she woke up from her sleep with pain, relatively constant throughout the day, improved while in the waiting room.  Currently 0.  Patient reports over the last week or 2 she has had increased shortness of breath, worse with lying down, worse with exertion.  Has had increased wheezing has improved with her home inhaler.  Per chart review, admission in May 2021 at [facility] facility for likely heart failure exacerbation, fluid overload.  Echo cardiogram at the time 5/22repeat [id number] to 55%, LV filling pattern is indeterminant, mild concentric LV hypertrophy.  Patient reports she has continued to take 40 mg p.o. Lasix daily.  No changes in his regimen recently.  CKD, diabetes, hyperlipidemia, hypertension, peripheral vascular disease on Plavix.  HPI     Past Medical History:  Diagnosis Date  . Chest pain, atypical   . Chronic renal failure    stage 3  . Diabetes mellitus without complication ([place])   . Diverticulitis   . High cholesterol   . Hyperlipidemia   . Hypertension   . Peripheral vascular disease ([place])     There are no problems to display for this patient.   Past Surgical History:  Procedure Laterality Date  . ABDOMINAL HYSTERECTOMY    . FEMORAL ARTERY STENT       OB History    Gravida  2   Para  2   Term      Preterm      AB      Living        SAB      IAB      Ectopic      Multiple      Live Births              History reviewed. No pertinent family history.  Social History    Tobacco Use  . Smoking status: Former Smoker    Quit date: 09/08/2008    Years since quitting: 11.9  . Smokeless tobacco: Never Used  Substance Use Topics  . Alcohol use: No  . Drug use: No    Home Medications Prior to Admission medications   Medication Sig Start Date End Date Taking? Authorizing Provider  allopurinol (ZYLOPRIM) 100 MG tablet Take 100 mg by mouth daily.    [provider]  B Complex Vitamins (B-COMPLEX/B-12 PO) Take by mouth.      [provider]  benzonatate (TESSALON) 100 MG capsule Take 1 capsule (100 mg total) by mouth every 8 (eight) hours. 09/17/14   [name], MD  clopidogrel (PLAVIX) 75 MG tablet Take 75 mg by mouth daily.      [provider]  ergocalciferol (VITAMIN D2) 50000 UNITS capsule Take 50,000 Units by mouth once a week.    [provider]  furosemide (LASIX) 40 MG tablet Take 1 tablet (40 mg total) by mouth daily. 01/02/20   Horton, Barbette Hair, MD  hydrochlorothiazide (HYDRODIURIL) 25 MG tablet Take 25 mg by mouth daily.  [provider]  HYDROcodone-acetaminophen (NORCO/VICODIN) 5-325 MG tablet Take 1-2 tablets by mouth every 6 (six) hours as needed. 06/26/19   Volanda Napoleon, PA-C  levofloxacin (LEVAQUIN) 500 MG tablet Take 500 mg by mouth daily.    [provider]  lidocaine (LIDODERM) 5 % Place 1 patch onto the skin daily. Remove & Discard patch within 12 hours or as directed by MD 06/26/19   Volanda Napoleon, PA-C  lisinopril (PRINIVIL,ZESTRIL) 40 MG tablet Take 40 mg by mouth daily.      [provider]  metFORMIN (GLUCOPHAGE) 500 MG tablet Take by mouth 2 (two) times daily with a meal.    [provider]  metroNIDAZOLE (FLAGYL) 500 MG tablet Take 500 mg by mouth 3 (three) times daily.    [provider]  omeprazole (PRILOSEC) 20 MG capsule Take 1 capsule (20 mg total) by mouth 2 (two) times daily. 03/04/17   Tanna Furry, MD  ondansetron (ZOFRAN ODT) 4 MG  disintegrating tablet Take 1 tablet (4 mg total) by mouth every 8 (eight) hours as needed for nausea. 09/17/14   [name], MD  oxyCODONE (OXYCONTIN) 10 MG 12 hr tablet Take 10 mg by mouth every 4 (four) hours as needed.    [provider]  pravastatin (PRAVACHOL) 20 MG tablet Take 10 mg by mouth daily.     [provider]  predniSONE (STERAPRED UNI-PAK 21 TAB) 10 MG (21) TBPK tablet Take by mouth daily. Take 6 tabs by mouth daily  for 2 days, then 5 tabs for 2 days, then 4 tabs for 2 days, then 3 tabs for 2 days, 2 tabs for 2 days, then 1 tab by mouth daily for 2 days 08/30/18   Frederica Kuster, PA-C    Allergies    Patient has no known allergies.  Review of Systems   Review of Systems  Constitutional: Negative for chills and fever.  HENT: Negative for ear pain and sore throat.   Eyes: Negative for pain and visual disturbance.  Respiratory: Positive for chest tightness and shortness of breath. Negative for cough.   Cardiovascular: Positive for chest pain. Negative for palpitations.  Gastrointestinal: Negative for abdominal pain and vomiting.  Genitourinary: Negative for dysuria and hematuria.  Musculoskeletal: Negative for arthralgias and back pain.  Skin: Negative for color change and rash.  Neurological: Negative for seizures and syncope.  All other systems reviewed and are negative.   Physical Exam Updated Vital Signs BP (!) 177/75 (BP Location: Right Arm)   Pulse 74   Temp 97.7 F (36.5 C) (Oral)   Resp 19   Ht 5\' 2"  (1.575 m)   Wt 122.5 kg   SpO2 100%   BMI 49.38 kg/m   Physical Exam Vitals and nursing note reviewed.  Constitutional:      General: She is not in acute distress.    Appearance: She is well-developed and well-nourished.  HENT:     Head: Normocephalic and atraumatic.  Eyes:     Conjunctiva/sclera: Conjunctivae normal.  Cardiovascular:     Rate and Rhythm: Normal rate and regular rhythm.     Heart sounds: No murmur  heard.   Pulmonary:     Effort: No respiratory distress.     Comments: Somewhat diminished at bases, mild end expiratory wheeze, no distress Abdominal:     Palpations: Abdomen is soft.     Tenderness: There is no abdominal tenderness.  Musculoskeletal:        General: No edema.  Cervical back: Neck supple.     Comments: Bilateral lower extremity edema to level of knee, extremities are warm and well-perfused  Skin:    General: Skin is warm and dry.  Neurological:     Mental Status: She is alert.  Psychiatric:        Mood and Affect: Mood and affect normal.     ED Results / Procedures / Treatments   Labs (all labs ordered are listed, but only abnormal results are displayed) Labs Reviewed  BASIC METABOLIC PANEL - Abnormal; Notable for the following components:      Result Value   BUN 26 (*)    Creatinine, Ser 1.21 (*)    GFR, Estimated 48 (*)    All other components within normal limits  CBC - Abnormal; Notable for the following components:   WBC 12.0 (*)    Hemoglobin 11.6 (*)    RDW 16.0 (*)    All other components within normal limits  BRAIN NATRIURETIC PEPTIDE - Abnormal; Notable for the following components:   B Natriuretic Peptide 334.6 (*)    All other components within normal limits  RESP PANEL BY RT-PCR (FLU A&B, COVID) [id number]  TROPONIN I (HIGH SENSITIVITY)  TROPONIN I (HIGH SENSITIVITY)    EKG EKG Interpretation  Date/Time:  Thursday August 23 2020 14:28:40 EST Ventricular Rate:  74 PR Interval:  158 QRS Duration: 72 QT Interval:  412 QTC Calculation: 457 R Axis:   8 Text Interpretation: Sinus rhythm with Premature atrial complexes with Abberant conduction Low voltage QRS Cannot rule out Inferior infarct , age undetermined Abnormal ECG Confirmed by Madalyn Rob [id number]) on 08/23/2020 3:55:32 PM   Radiology DG Chest 2 View  Result Date: 08/23/2020 CLINICAL DATA:  Chest pain and shortness of breath. Ankle swelling. EXAM: CHEST - 2 VIEW  COMPARISON:  Radiograph and CT 01/27/2020 FINDINGS: Cardiomegaly is similar to prior. Aortic atherosclerosis. There is diffuse peribronchial thickening. Trace fluid in the fissures without large subpulmonic effusion. No confluent consolidation or pneumothorax. Diffuse thoracic spondylosis. Soft tissue attenuation from habitus limits detailed assessment. IMPRESSION: Cardiomegaly. Diffuse peribronchial thickening, may be pulmonary edema or bronchitis. Electronically Signed   By: Keith Rake M.D.   On: 08/23/2020 16:44    Procedures Procedures (including critical care time)  Medications Ordered in ED Medications  furosemide (LASIX) injection 40 mg (40 mg Intravenous Given 08/23/20 1740)  albuterol (VENTOLIN HFA) 108 (90 Base) MCG/ACT inhaler 2 puff (2 puffs Inhalation Given 08/23/20 1825)  potassium chloride SA (KLOR-CON) CR tablet 40 mEq (40 mEq Oral Given 08/23/20 1933)    ED Course  I have reviewed the triage vital signs and the nursing notes.  Pertinent labs & imaging results that were available during my care of the patient were reviewed by me and considered in my medical decision making (see chart for details).  Clinical Course as of 08/24/20 0005  Thu Aug 23, 2020  1932 On reassessment, no recurrence of chest pain, states breathing feels improved [RD]    Clinical Course User Index [RD] Lucrezia Starch, MD   MDM Rules/Calculators/A&P                         74 year old lady presented to ER with concern for episode of chest pain as well as increased shortness of breath.  On physical exam, patient noted to be well-appearing in no distress, noted some edema in her lower legs.  EKG without acute ischemic change, troponin x2 within  normal limits, given these findings and her atypical description of chest pain, very low suspicion for ACS.  No hypoxia, no tachycardia, low suspicion for PE.  BNP was mildly elevated.  Suspect component of heart failure exacerbation.  CXR negative for  pneumonia, some vascular congestion.  Patient was provided dose of IV Lasix as well as albuterol for the mild wheeze.  Her breathing improved and she had no ongoing chest pain.  She ambulated throughout department without any difficulty or hypoxia.  On my reassessment, she remained well-appearing with normal vital signs.  Believe she is appropriate for outpatient management at this time.  Instructed patient to take an extra dose of her Lasix medicine in the evening for the next 2 to 3 days.  Instructed patient to contact primary doctor tomorrow morning to get close follow-up appointment early next week.  Reviewed strict return precautions should she have any recurrence of chest pain or increased work of breathing.   After the discussed management above, the patient was determined to be safe for discharge.  The patient was in agreement with this plan and all questions regarding their care were answered.  ED return precautions were discussed and the patient will return to the ED with any significant worsening of condition.  Final Clinical Impression(s) / ED Diagnoses Final diagnoses:  Chest pain, unspecified type  Heart failure, unspecified HF chronicity, unspecified heart failure type [facility])    Rx / DC Orders ED Discharge Orders    None       Lucrezia Starch, MD 08/24/20 0005

## 2020-08-23 NOTE — ED Triage Notes (Signed)
Chest pain started last night 

## 2020-08-23 NOTE — ED Notes (Signed)
Patient ambulated to bathroom on pulse ox. SAT consistently 96%, but noted SOB and HR increase with walking. Placed back in bed and on monitor.

## 2020-08-23 NOTE — ED Notes (Signed)
States she has chest pain, presents more with SOB, states when moving around she has increased SOB, onset last PM. Noted to have swelling in ankles, states has difficulty lying down to sleep at night.

## 2020-08-23 NOTE — ED Notes (Signed)
AVS reviewed with client, discussed observing for signs and symptoms that would need for her to return to the ED. Also informed pt that her provider here has recommended for her to increase her PO lasix dose for the next 3 days, and to also follow up with her primary care MD for reevaluation of her lab work. Opportunity for questions provided.

## 2020-09-23 IMAGING — DX DG CHEST 1V PORT
1 series · 1 of 1 positions shown · non-contrast
Comparison: 03/18/2019

CLINICAL DATA: Shortness of breath

EXAM:
PORTABLE CHEST 1 VIEW

[chest ap]
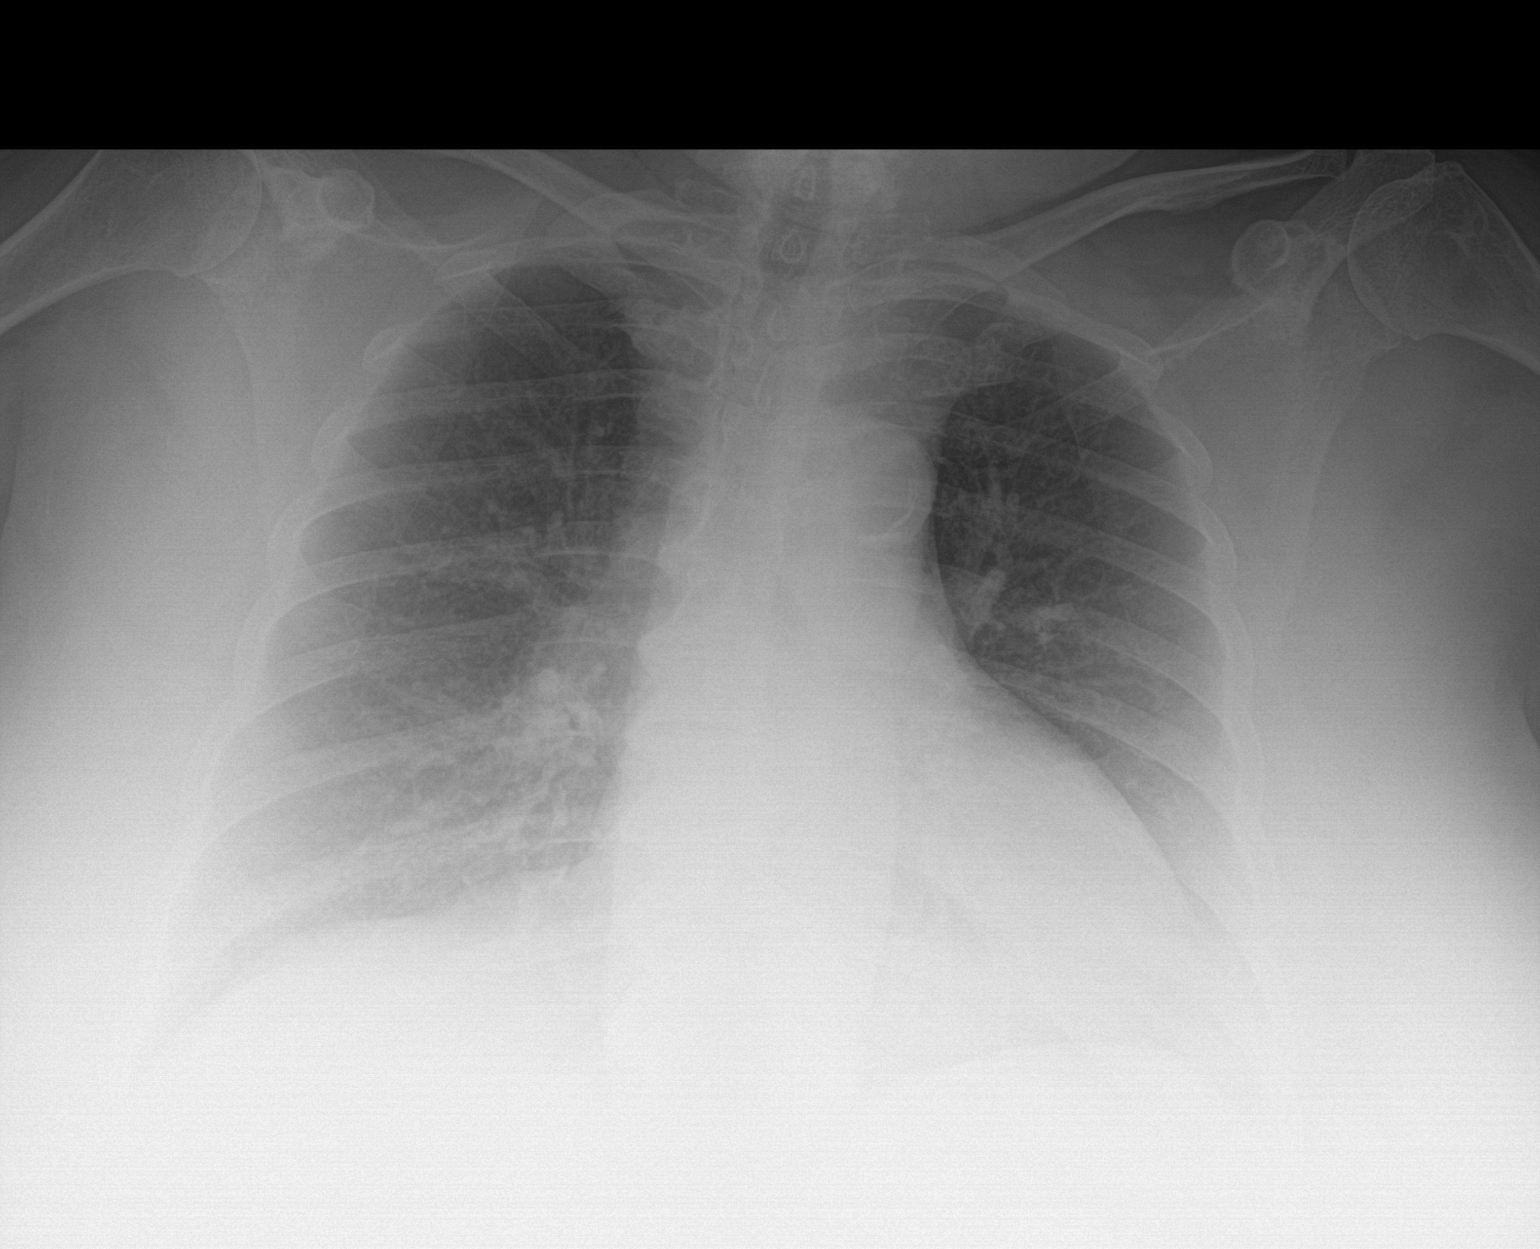

[1 of 1 positions shown; findings below may reference images not displayed]

FINDINGS: Poor penetration secondary to patient body habitus. The heart size
and mediastinal contours are within normal limits. No focal airspace
consolidation, pleural effusion, or pneumothorax. The visualized
skeletal structures are unremarkable.
IMPRESSION: No acute cardiopulmonary findings.

## 2021-03-31 IMAGING — DX DG CHEST 2V
2 series · 2 of 2 positions shown · non-contrast
Comparison: Chest radiograph dated 06/26/2019.

CLINICAL DATA: 73-year-old female with shortness of breath.

EXAM:
CHEST - 2 VIEW

[chest lat]
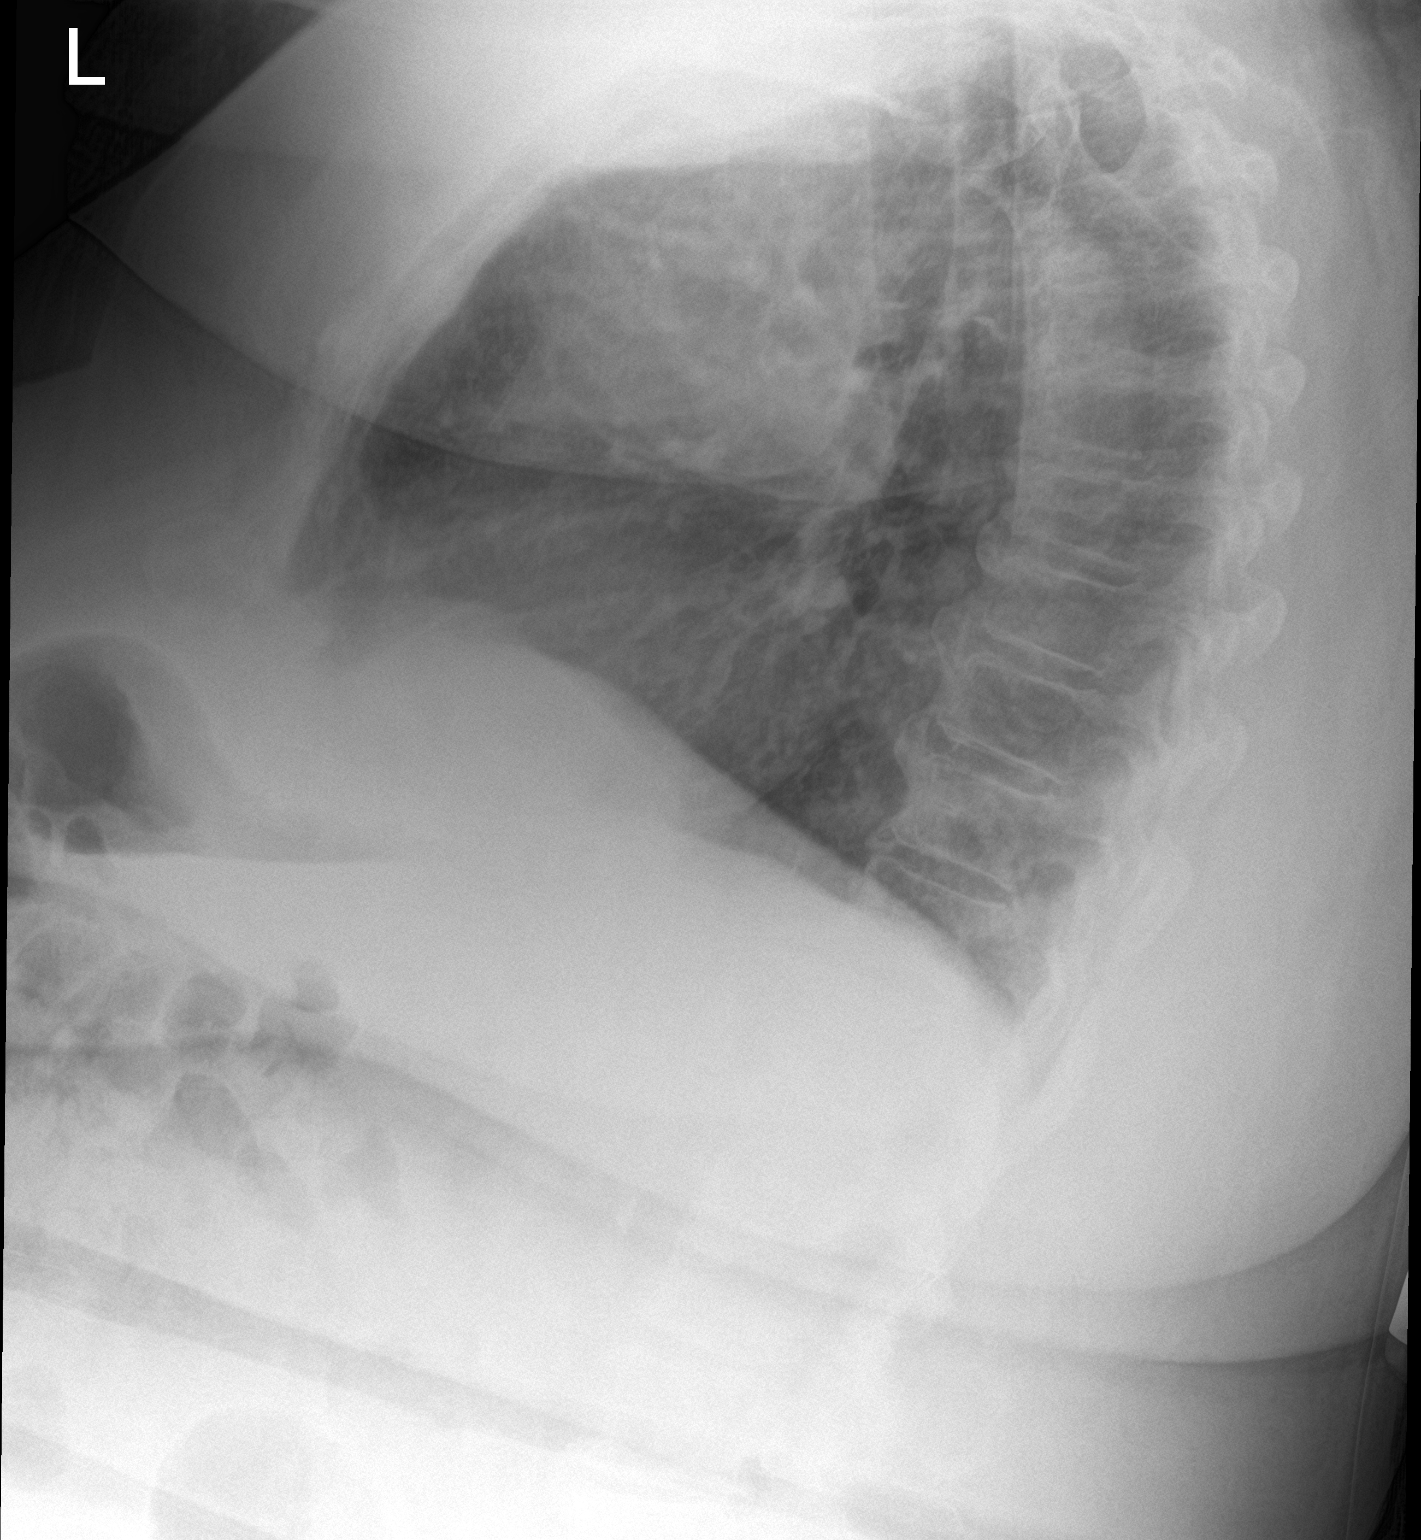

[chest ap strecther]
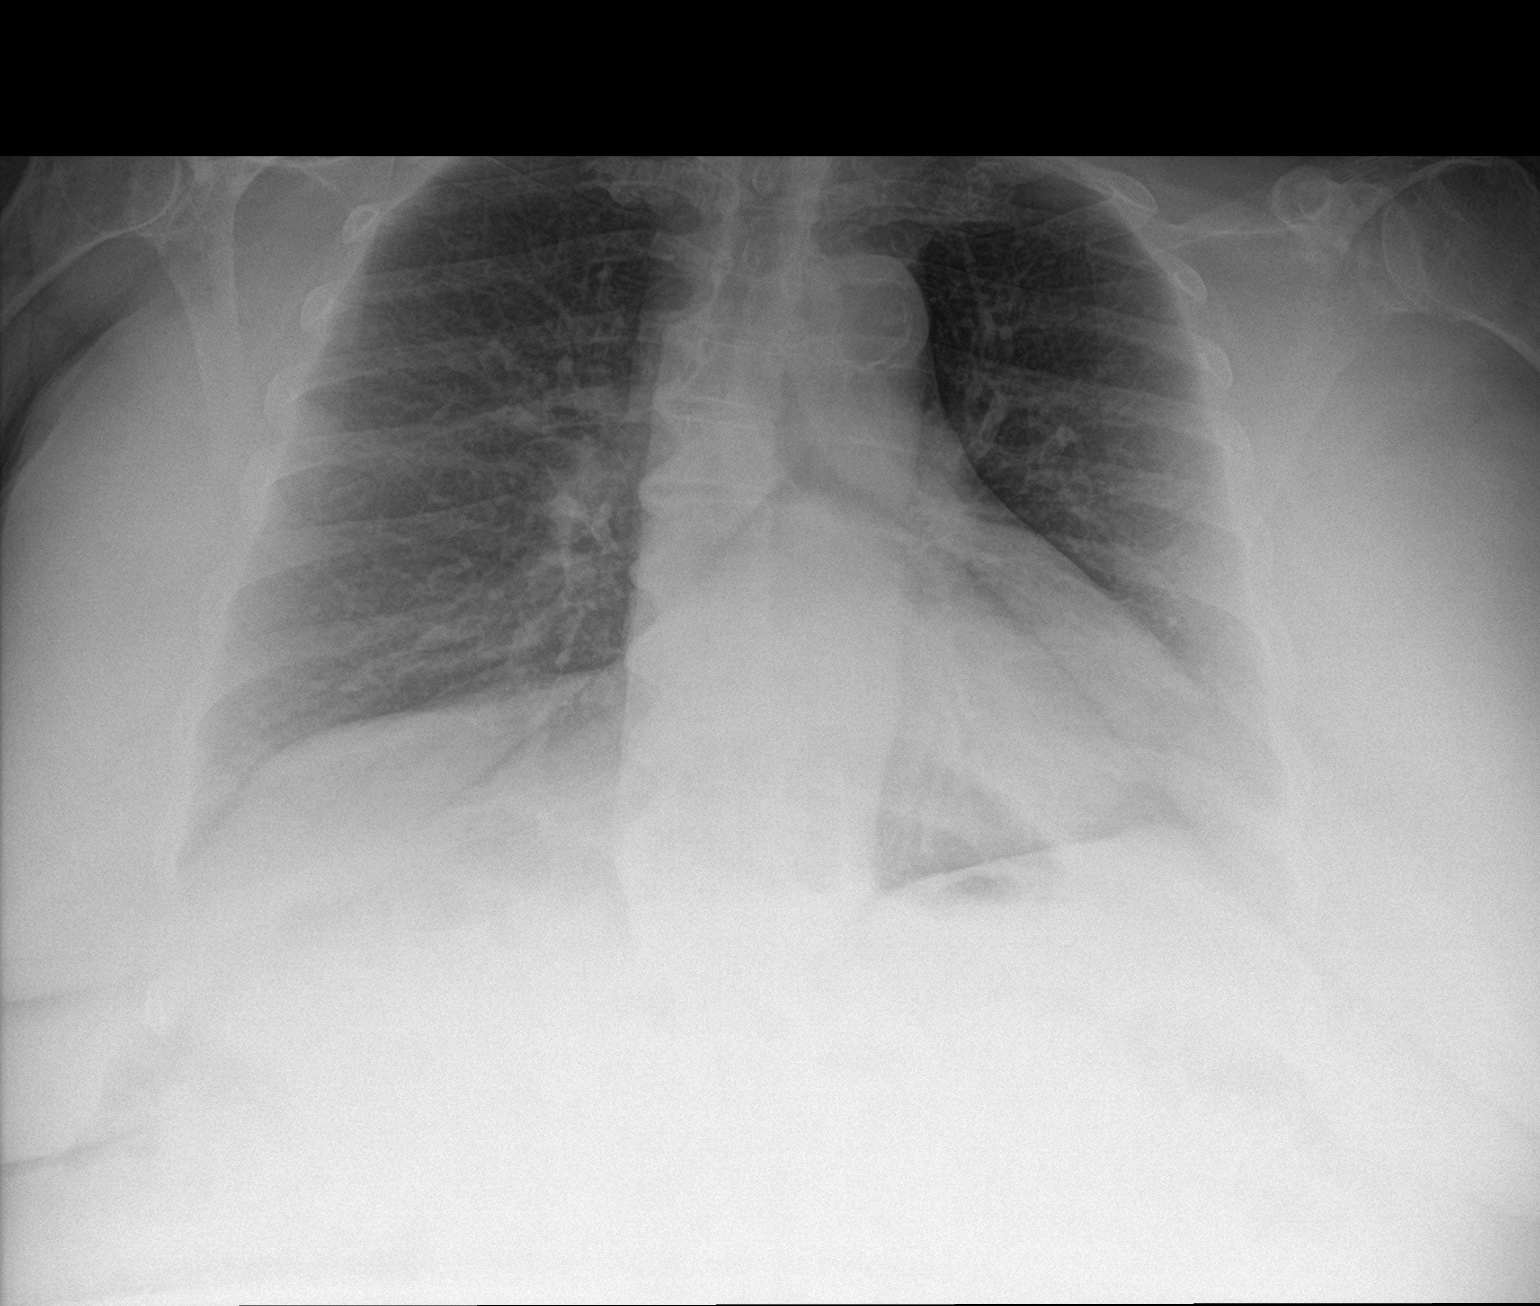

[2 of 2 positions shown; findings below may reference images not displayed]

FINDINGS: No focal consolidation, pleural effusion, pneumothorax. The cardiac
silhouette is within limits. Atherosclerotic calcification of the
aorta. Degenerative changes of the spine. No acute osseous
pathology.
IMPRESSION: No active cardiopulmonary disease.

## 2021-07-28 ENCOUNTER — Encounter (HOSPITAL_BASED_OUTPATIENT_CLINIC_OR_DEPARTMENT_OTHER): Payer: Self-pay | Admitting: Urology

## 2021-07-28 ENCOUNTER — Emergency Department (HOSPITAL_BASED_OUTPATIENT_CLINIC_OR_DEPARTMENT_OTHER): Payer: Medicare Other

## 2021-07-28 ENCOUNTER — Emergency Department (HOSPITAL_BASED_OUTPATIENT_CLINIC_OR_DEPARTMENT_OTHER)
Admission: EM | Admit: 2021-07-28 | Discharge: 2021-07-28 | Disposition: A | Discharge status: Home / Self Care | Payer: Medicare Other | Attending: Emergency Medicine | Admitting: Emergency Medicine

## 2021-07-28 ENCOUNTER — Other Ambulatory Visit: Payer: Self-pay

## 2021-07-28 DIAGNOSIS — E1122 Type 2 diabetes mellitus with diabetic chronic kidney disease: Secondary | ICD-10-CM | POA: Insufficient documentation

## 2021-07-28 DIAGNOSIS — D72829 Elevated white blood cell count, unspecified: Secondary | ICD-10-CM | POA: Diagnosis not present

## 2021-07-28 DIAGNOSIS — I129 Hypertensive chronic kidney disease with stage 1 through stage 4 chronic kidney disease, or unspecified chronic kidney disease: Secondary | ICD-10-CM | POA: Insufficient documentation

## 2021-07-28 DIAGNOSIS — J40 Bronchitis, not specified as acute or chronic: Secondary | ICD-10-CM | POA: Insufficient documentation

## 2021-07-28 DIAGNOSIS — N183 Chronic kidney disease, stage 3 unspecified: Secondary | ICD-10-CM | POA: Diagnosis not present

## 2021-07-28 DIAGNOSIS — Z87891 Personal history of nicotine dependence: Secondary | ICD-10-CM | POA: Diagnosis not present

## 2021-07-28 DIAGNOSIS — Z79899 Other long term (current) drug therapy: Secondary | ICD-10-CM | POA: Diagnosis not present

## 2021-07-28 DIAGNOSIS — R0789 Other chest pain: Secondary | ICD-10-CM

## 2021-07-28 DIAGNOSIS — Z7984 Long term (current) use of oral hypoglycemic drugs: Secondary | ICD-10-CM | POA: Insufficient documentation

## 2021-07-28 DIAGNOSIS — Z7902 Long term (current) use of antithrombotics/antiplatelets: Secondary | ICD-10-CM | POA: Diagnosis not present

## 2021-07-28 LAB — CBC
HCT: 38.5 % (ref 36.0–46.0)
Hemoglobin: 12.1 g/dL (ref 12.0–15.0)
MCH: 25.5 pg — ABNORMAL LOW (ref 26.0–34.0)
MCHC: 31.4 g/dL (ref 30.0–36.0)
MCV: 81.2 fL (ref 80.0–100.0)
Platelets: 272 10*3/uL (ref 150–400)
RBC: 4.74 MIL/uL (ref 3.87–5.11)
RDW: 16.2 % — ABNORMAL HIGH (ref 11.5–15.5)
WBC: 14.3 10*3/uL — ABNORMAL HIGH (ref 4.0–10.5)
nRBC: 0 % (ref 0.0–0.2)

## 2021-07-28 LAB — TROPONIN I (HIGH SENSITIVITY)
Troponin I (High Sensitivity): 8 ng/L (ref <18)
Troponin I (High Sensitivity): 8 ng/L (ref <18)

## 2021-07-28 LAB — BASIC METABOLIC PANEL
Anion gap: 11 (ref 5–15)
BUN: 24 mg/dL — ABNORMAL HIGH (ref 8–23)
CO2: 22 mmol/L (ref 22–32)
Calcium: 9.1 mg/dL (ref 8.9–10.3)
Chloride: 105 mmol/L (ref 98–111)
Creatinine, Ser: 1.25 mg/dL — ABNORMAL HIGH (ref 0.44–1.00)
GFR, Estimated: 46 mL/min — ABNORMAL LOW (ref >60)
Glucose, Bld: 106 mg/dL — ABNORMAL HIGH (ref 70–99)
Potassium: 3.5 mmol/L (ref 3.5–5.1)
Sodium: 138 mmol/L (ref 135–145)

## 2021-07-28 NOTE — ED Provider Notes (Signed)
[name] Provider Note  CSN: [id number] Arrival date & time: 07/28/21 1709    History Chief Complaint  Patient presents with   Chest Pain    Holly Perkins is a 75 y.o. female with history of HTN, DM, HLD, PVD s/p femoral artery stent reports intermittent chest pains since yesterday evening, described as a pressure, not associated with SOB. No nausea or diaphoresis. She reports earlier today she began having some aching pain in her L arm which was new. She is currently pain free. She has had a cough and wheezing recently, saw her PCP 4 days ago and started on prednisone and Symbicort with some improvement.    Past Medical History:  Diagnosis Date   Chest pain, atypical    Chronic renal failure    stage 3   Diabetes mellitus without complication ([facility])    Diverticulitis    High cholesterol    Hyperlipidemia    Hypertension    Peripheral vascular disease ([place])     Past Surgical History:  Procedure Laterality Date   ABDOMINAL HYSTERECTOMY     FEMORAL ARTERY STENT      History reviewed. No pertinent family history.  Social History   Tobacco Use   Smoking status: Former    Types: Cigarettes    Quit date: 09/08/2008    Years since quitting: 12.8   Smokeless tobacco: Never  Substance Use Topics   Alcohol use: No   Drug use: No     Home Medications Prior to Admission medications   Medication Sig Start Date End Date Taking? Authorizing Provider  allopurinol (ZYLOPRIM) 100 MG tablet Take 100 mg by mouth daily.    [provider]  B Complex Vitamins (B-COMPLEX/B-12 PO) Take by mouth.      [provider]  benzonatate (TESSALON) 100 MG capsule Take 1 capsule (100 mg total) by mouth every 8 (eight) hours. 09/17/14   [name], MD  clopidogrel (PLAVIX) 75 MG tablet Take 75 mg by mouth daily.      [provider]  ergocalciferol (VITAMIN D2) 50000 UNITS capsule Take 50,000 Units by mouth once a week.     [provider]  furosemide (LASIX) 40 MG tablet Take 1 tablet (40 mg total) by mouth daily. 01/02/20   Horton, Barbette Hair, MD  hydrochlorothiazide (HYDRODIURIL) 25 MG tablet Take 25 mg by mouth daily.      [provider]  HYDROcodone-acetaminophen (NORCO/VICODIN) 5-325 MG tablet Take 1-2 tablets by mouth every 6 (six) hours as needed. 06/26/19   Volanda Napoleon, PA-C  levofloxacin (LEVAQUIN) 500 MG tablet Take 500 mg by mouth daily.    [provider]  lidocaine (LIDODERM) 5 % Place 1 patch onto the skin daily. Remove & Discard patch within 12 hours or as directed by MD 06/26/19   Volanda Napoleon, PA-C  lisinopril (PRINIVIL,ZESTRIL) 40 MG tablet Take 40 mg by mouth daily.      [provider]  metFORMIN (GLUCOPHAGE) 500 MG tablet Take by mouth 2 (two) times daily with a meal.    [provider]  metroNIDAZOLE (FLAGYL) 500 MG tablet Take 500 mg by mouth 3 (three) times daily.    [provider]  omeprazole (PRILOSEC) 20 MG capsule Take 1 capsule (20 mg total) by mouth 2 (two) times daily. 03/04/17   Tanna Furry, MD  ondansetron (ZOFRAN ODT) 4 MG disintegrating tablet Take 1 tablet (4 mg total) by mouth every 8 (eight) hours as needed  for nausea. 09/17/14   [name], MD  oxyCODONE (OXYCONTIN) 10 MG 12 hr tablet Take 10 mg by mouth every 4 (four) hours as needed.    [provider]  pravastatin (PRAVACHOL) 20 MG tablet Take 10 mg by mouth daily.     [provider]  predniSONE (STERAPRED UNI-PAK 21 TAB) 10 MG (21) TBPK tablet Take by mouth daily. Take 6 tabs by mouth daily  for 2 days, then 5 tabs for 2 days, then 4 tabs for 2 days, then 3 tabs for 2 days, 2 tabs for 2 days, then 1 tab by mouth daily for 2 days 08/30/18   Frederica Kuster, PA-C     Allergies    Patient has no known allergies.   Review of Systems   Review of Systems A comprehensive review of systems was completed and negative except as noted in HPI.     Physical Exam BP (!) 177/63 (BP Location: Right Arm)   Pulse 60   Temp 97.6 F (36.4 C) (Oral)   Resp 18   Ht 5\' 2"  (1.575 m)   Wt 122.5 kg   SpO2 97%   BMI 49.38 kg/m   Physical Exam Vitals and nursing note reviewed.  Constitutional:      Appearance: Normal appearance.  HENT:     Head: Normocephalic and atraumatic.     Nose: Nose normal.     Mouth/Throat:     Mouth: Mucous membranes are moist.  Eyes:     Extraocular Movements: Extraocular movements intact.     Conjunctiva/sclera: Conjunctivae normal.  Cardiovascular:     Rate and Rhythm: Normal rate.  Pulmonary:     Effort: Pulmonary effort is normal.     Breath sounds: Normal breath sounds.  Chest:     Chest wall: Tenderness present.  Abdominal:     General: Abdomen is flat.     Palpations: Abdomen is soft.     Tenderness: There is no abdominal tenderness.  Musculoskeletal:        General: No swelling. Normal range of motion.     Cervical back: Neck supple.  Skin:    General: Skin is warm and dry.  Neurological:     General: No focal deficit present.     Mental Status: She is alert.  Psychiatric:        Mood and Affect: Mood normal.     ED Results / Procedures / Treatments   Labs (all labs ordered are listed, but only abnormal results are displayed) Labs Reviewed  BASIC METABOLIC PANEL - Abnormal; Notable for the following components:      Result Value   Glucose, Bld 106 (*)    BUN 24 (*)    Creatinine, Ser 1.25 (*)    GFR, Estimated 46 (*)    All other components within normal limits  CBC - Abnormal; Notable for the following components:   WBC 14.3 (*)    MCH 25.5 (*)    RDW 16.2 (*)    All other components within normal limits  TROPONIN I (HIGH SENSITIVITY)  TROPONIN I (HIGH SENSITIVITY)    EKG EKG Interpretation  Date/Time:  Sunday July 28 2021 17:22:14 EST Ventricular Rate:  66 PR Interval:  160 QRS Duration: 80 QT Interval:  388 QTC Calculation: 406 R Axis:   13 Text  Interpretation: Normal sinus rhythm Low voltage QRS Possible Inferior infarct , age undetermined Abnormal ECG No significant change since last tracing Confirmed by Calvert Cantor [phone]) on  07/28/2021 5:25:01 PM   Radiology DG Chest 2 View  Result Date: 07/28/2021 CLINICAL DATA:  Chest pain. EXAM: CHEST - 2 VIEW COMPARISON:  August 23, 2020 FINDINGS: The heart size and mediastinal contours are within normal limits. Both lungs are clear. The visualized skeletal structures are unremarkable. IMPRESSION: No active cardiopulmonary disease. Electronically Signed   By: Dorise Bullion III M.D.   On: 07/28/2021 18:40    Procedures Procedures  Medications Ordered in the ED Medications - No data to display   MDM Rules/Calculators/A&P MDM Patient with chest pain with atypical symptoms but significant risk factors. Will check labs, delta trop, CXR and reassess.   ED Course  I have reviewed the triage vital signs and the nursing notes.  Pertinent labs & imaging results that were available during my care of the patient were reviewed by me and considered in my medical decision making (see chart for details).  Clinical Course as of 07/28/21 2116  Nancy Fetter Jul 28, 2021  2015 CBC with mild leukocytosis, consistent with recent steroid use. BMP with CKD at baseline. Trop #1 is normal. Awaiting delta. She is pain free at this time.  [CS]  2114 Repeat trop remains normal. Discussed inpatient vs outpatient evaluation and she prefers to go home. She was advised to return to the ED if symptoms worsen or she has any other concerns and to follow up with her PCP tomorrow for further management.  [CS]    Clinical Course User Index [CS] Truddie Hidden, MD    Final Clinical Impression(s) / ED Diagnoses Final diagnoses:  Atypical chest pain  Bronchitis    Rx / DC Orders ED Discharge Orders     None        Truddie Hidden, MD 07/28/21 2116

## 2021-07-28 NOTE — ED Notes (Signed)
ED Provider at bedside. 

## 2021-07-28 NOTE — ED Triage Notes (Signed)
Pt states chest pain with left arm numbness that started last night intermittent.  Pain in central chest.  Pt SOB with exertion.

## 2021-11-21 IMAGING — DX DG CHEST 2V
3 series · 3 of 3 positions shown · non-contrast
Comparison: Radiograph and CT 01/27/2020

CLINICAL DATA: Chest pain and shortness of breath. Ankle swelling.

EXAM:
CHEST - 2 VIEW

[chest pa]
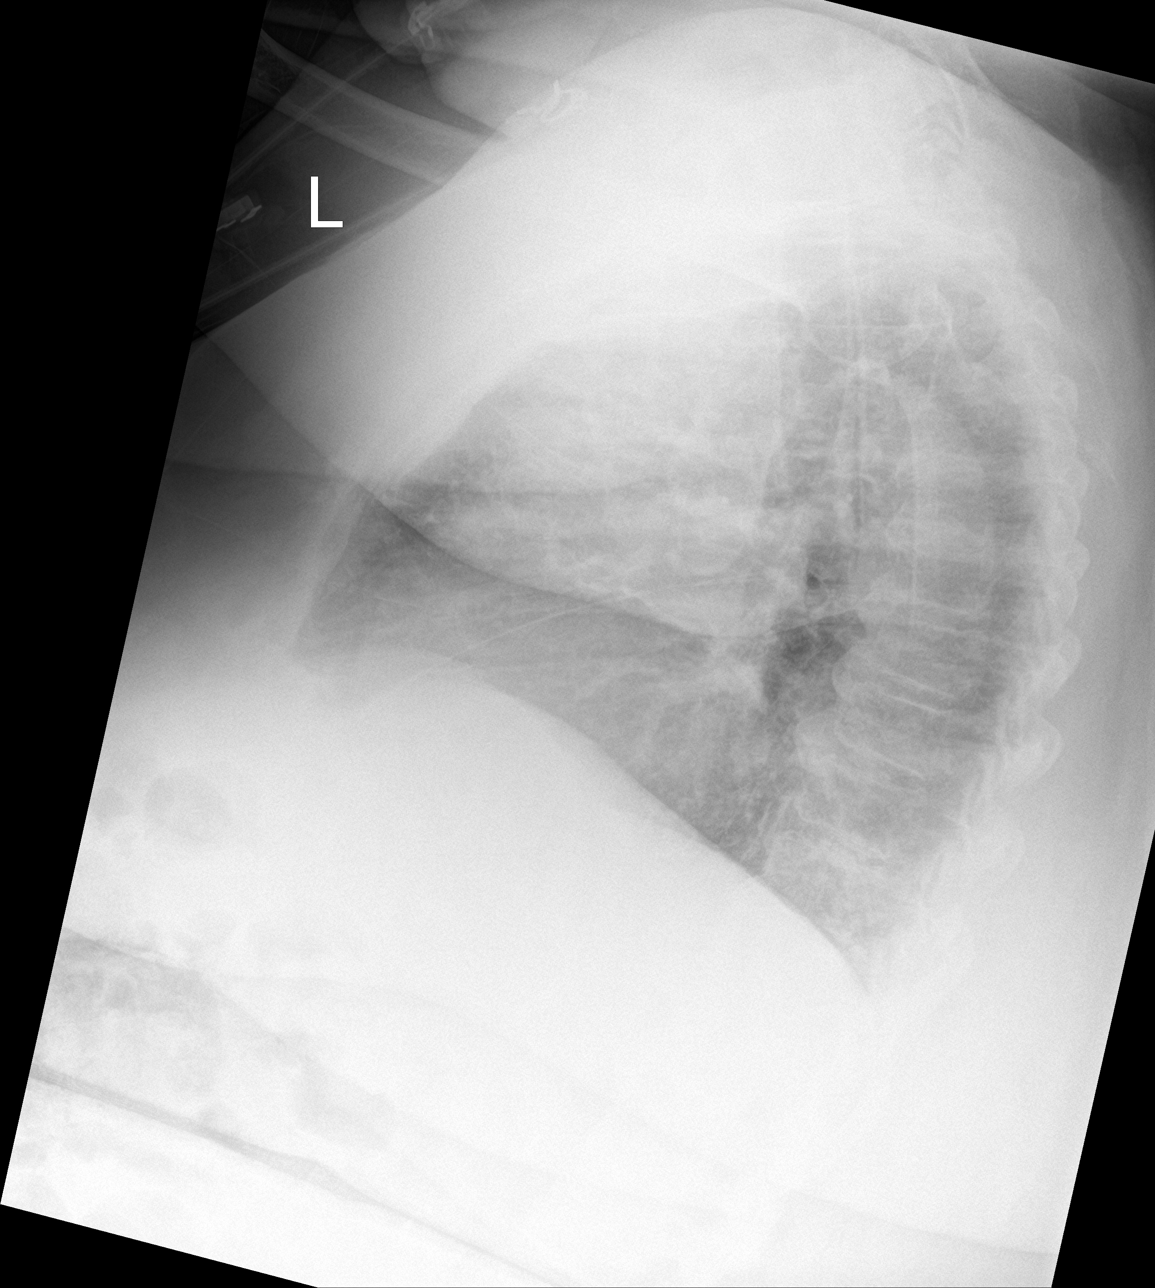

[chest ap]
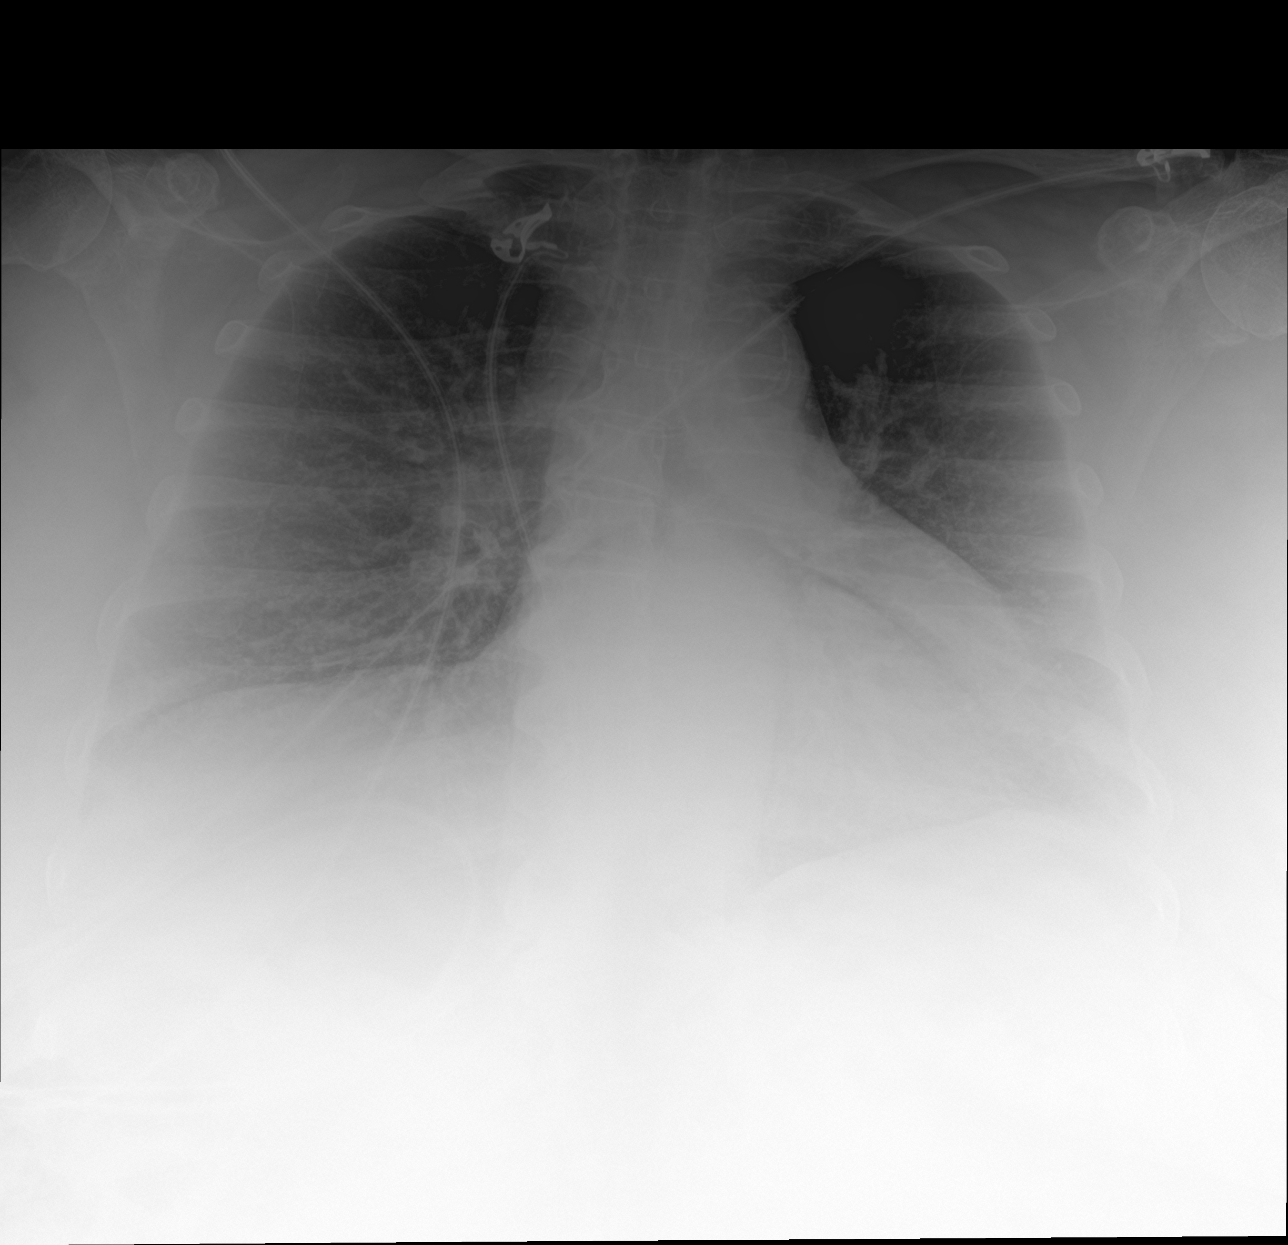

[chest ap strecther]
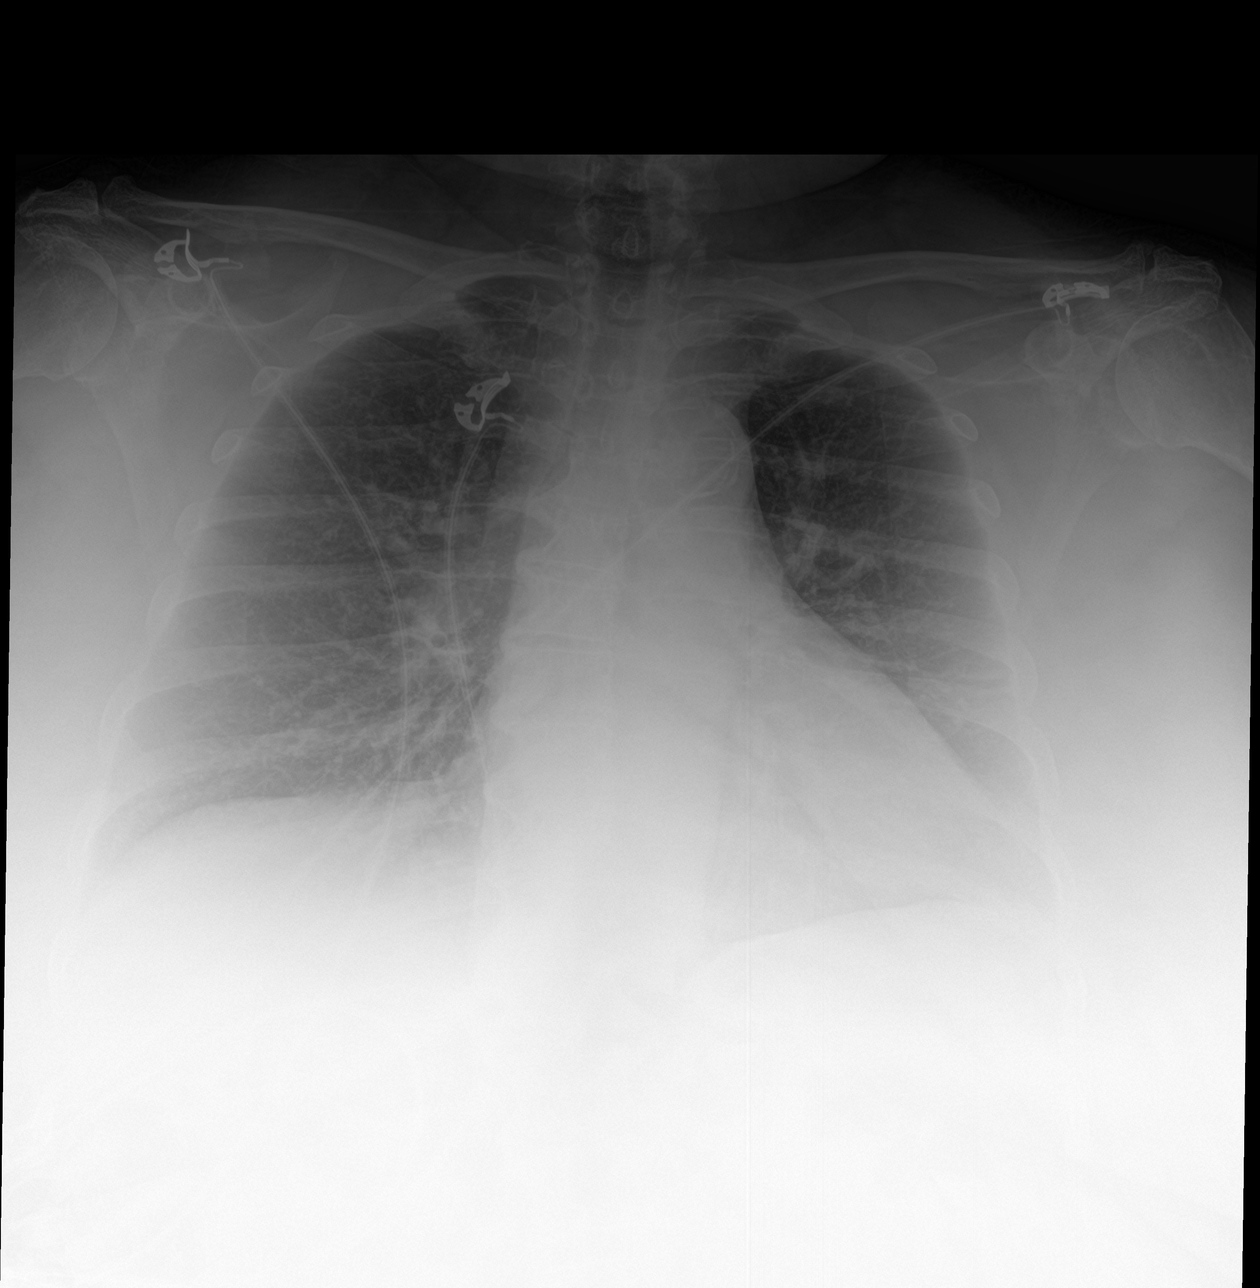

[3 of 3 positions shown; findings below may reference images not displayed]

FINDINGS: Cardiomegaly is similar to prior. Aortic atherosclerosis. There is
diffuse peribronchial thickening. Trace fluid in the fissures
without large subpulmonic effusion. No confluent consolidation or
pneumothorax. Diffuse thoracic spondylosis. Soft tissue attenuation
from habitus limits detailed assessment.
IMPRESSION: Cardiomegaly. Diffuse peribronchial thickening, may be pulmonary
edema or bronchitis.

## 2022-03-29 ENCOUNTER — Encounter (HOSPITAL_BASED_OUTPATIENT_CLINIC_OR_DEPARTMENT_OTHER): Payer: Self-pay | Admitting: Emergency Medicine

## 2022-03-29 ENCOUNTER — Emergency Department (HOSPITAL_BASED_OUTPATIENT_CLINIC_OR_DEPARTMENT_OTHER)
Admission: EM | Admit: 2022-03-29 | Discharge: 2022-03-29 | Disposition: A | Discharge status: Home / Self Care | Payer: Medicare Other | Attending: Emergency Medicine | Admitting: Emergency Medicine

## 2022-03-29 ENCOUNTER — Emergency Department (HOSPITAL_BASED_OUTPATIENT_CLINIC_OR_DEPARTMENT_OTHER): Payer: Medicare Other

## 2022-03-29 DIAGNOSIS — E119 Type 2 diabetes mellitus without complications: Secondary | ICD-10-CM | POA: Insufficient documentation

## 2022-03-29 DIAGNOSIS — Z7984 Long term (current) use of oral hypoglycemic drugs: Secondary | ICD-10-CM | POA: Insufficient documentation

## 2022-03-29 DIAGNOSIS — R600 Localized edema: Secondary | ICD-10-CM | POA: Diagnosis not present

## 2022-03-29 DIAGNOSIS — I130 Hypertensive heart and chronic kidney disease with heart failure and stage 1 through stage 4 chronic kidney disease, or unspecified chronic kidney disease: Secondary | ICD-10-CM | POA: Diagnosis not present

## 2022-03-29 DIAGNOSIS — N189 Chronic kidney disease, unspecified: Secondary | ICD-10-CM | POA: Diagnosis not present

## 2022-03-29 DIAGNOSIS — R252 Cramp and spasm: Secondary | ICD-10-CM | POA: Insufficient documentation

## 2022-03-29 DIAGNOSIS — Z79899 Other long term (current) drug therapy: Secondary | ICD-10-CM | POA: Insufficient documentation

## 2022-03-29 DIAGNOSIS — I509 Heart failure, unspecified: Secondary | ICD-10-CM | POA: Insufficient documentation

## 2022-03-29 DIAGNOSIS — R0602 Shortness of breath: Secondary | ICD-10-CM | POA: Diagnosis present

## 2022-03-29 LAB — CBC WITH DIFFERENTIAL/PLATELET
Abs Immature Granulocytes: 0.03 10*3/uL (ref 0.00–0.07)
Basophils Absolute: 0.1 10*3/uL (ref 0.0–0.1)
Basophils Relative: 1 %
Eosinophils Absolute: 0.3 10*3/uL (ref 0.0–0.5)
Eosinophils Relative: 3 %
HCT: 37.2 % (ref 36.0–46.0)
Hemoglobin: 11.7 g/dL — ABNORMAL LOW (ref 12.0–15.0)
Immature Granulocytes: 0 %
Lymphocytes Relative: 21 %
Lymphs Abs: 2.2 10*3/uL (ref 0.7–4.0)
MCH: 24.5 pg — ABNORMAL LOW (ref 26.0–34.0)
MCHC: 31.5 g/dL (ref 30.0–36.0)
MCV: 78 fL — ABNORMAL LOW (ref 80.0–100.0)
Monocytes Absolute: 0.9 10*3/uL (ref 0.1–1.0)
Monocytes Relative: 8 %
Neutro Abs: 7 10*3/uL (ref 1.7–7.7)
Neutrophils Relative %: 67 %
Platelets: 259 10*3/uL (ref 150–400)
RBC: 4.77 MIL/uL (ref 3.87–5.11)
RDW: 17.7 % — ABNORMAL HIGH (ref 11.5–15.5)
WBC: 10.6 10*3/uL — ABNORMAL HIGH (ref 4.0–10.5)
nRBC: 0 % (ref 0.0–0.2)

## 2022-03-29 LAB — BASIC METABOLIC PANEL
Anion gap: 6 (ref 5–15)
BUN: 20 mg/dL (ref 8–23)
CO2: 25 mmol/L (ref 22–32)
Calcium: 9.2 mg/dL (ref 8.9–10.3)
Chloride: 109 mmol/L (ref 98–111)
Creatinine, Ser: 1.19 mg/dL — ABNORMAL HIGH (ref 0.44–1.00)
GFR, Estimated: 48 mL/min — ABNORMAL LOW (ref >60)
Glucose, Bld: 102 mg/dL — ABNORMAL HIGH (ref 70–99)
Potassium: 4.1 mmol/L (ref 3.5–5.1)
Sodium: 140 mmol/L (ref 135–145)

## 2022-03-29 LAB — TROPONIN I (HIGH SENSITIVITY): Troponin I (High Sensitivity): 17 ng/L (ref <18)

## 2022-03-29 LAB — BRAIN NATRIURETIC PEPTIDE: B Natriuretic Peptide: 426.9 pg/mL — ABNORMAL HIGH (ref 0.0–100.0)

## 2022-03-29 MED ORDER — FUROSEMIDE 10 MG/ML IJ SOLN
40.0000 mg | Freq: Once | INTRAMUSCULAR | Status: AC
  Administered 2022-03-29: 40 mg via INTRAVENOUS
  Filled 2022-03-29: qty 4

## 2022-03-29 MED ORDER — METHOCARBAMOL 500 MG PO TABS: 500.0000 mg | ORAL_TABLET | Freq: Three times a day (TID) | ORAL | 0 refills | Status: DC | PRN

## 2022-03-29 MED ORDER — FENTANYL CITRATE PF 50 MCG/ML IJ SOSY
PREFILLED_SYRINGE | INTRAMUSCULAR | Status: AC
  Filled 2022-03-29: qty 1

## 2022-03-29 MED ORDER — FENTANYL CITRATE PF 50 MCG/ML IJ SOSY
50.0000 ug | PREFILLED_SYRINGE | Freq: Once | INTRAMUSCULAR | Status: AC
  Administered 2022-03-29: 50 ug via INTRAVENOUS

## 2022-03-29 MED ORDER — METHOCARBAMOL 500 MG PO TABS
500.0000 mg | ORAL_TABLET | Freq: Once | ORAL | Status: AC
  Administered 2022-03-29: 500 mg via ORAL
  Filled 2022-03-29: qty 1

## 2022-03-29 MED ORDER — METHOCARBAMOL 750 MG PO TABS: 750.0000 mg | ORAL_TABLET | Freq: Four times a day (QID) | ORAL | 0 refills | Status: DC

## 2022-03-29 NOTE — Discharge Instructions (Addendum)
You were seen in the emergency department for an exacerbation of your congestive heart failure which was causing your shortness of breath when walking and when laying down as well as the increased swelling in your legs.  We did not find any signs of a heart attack or pneumonia during your visit.  We treated you with an IV dose of Lasix and will increase your daily dose of Lasix from 40 mg once daily to 40 mg twice daily.  Please refer to the attached instructions on leg cramps for tips on avoiding cramps.  Take your Lasix twice daily for the next 3 days.  Take the Robaxin muscle relaxer up to 3 times a day as needed for cramps.  Please call both your primary care physician and cardiologist on Monday to schedule follow-up visits this week.  If you experience worsening shortness of breath, new or worsening chest pain, severe fatigue, or other worrisome symptoms please return to the emergency department.

## 2022-03-29 NOTE — ED Notes (Signed)
Purewick applied.

## 2022-03-29 NOTE — ED Notes (Signed)
Patient verbalizes understanding of discharge instructions. Opportunity for questioning and answers were provided. Armband removed by staff, pt discharged from ED. Wheeled out to lobby with visitor

## 2022-03-29 NOTE — ED Provider Notes (Signed)
[facility] EMERGENCY DEPARTMENT Provider Note   CSN: [id number] Arrival date & time: 03/29/22  1902     History  Chief Complaint  Patient presents with   Shortness of Breath    Holly Perkins is a 76 y.o. female.  Holly Perkins is a 76 year old female with a past medical history of hypertension, diabetes, chronic kidney disease who presents with 1 month of progressively worsening orthopnea, dyspnea on exertion, and lower extremity edema.  She is currently on Lasix 40 mg, HCTZ 25 mg, lisinopril 40 mg daily and has taken his medications consistently but still notices these worsening symptoms.  She sleeps with 1 pillow currently and notices dyspnea when walking on flat ground.  She has pleuritic chest pain whenever she gets short of breath but denies persistent chest pain or syncope.  She states that she has never had an echocardiogram done before but she has had cardiac stress test in the past with the last one being around a year ago and she was told that this was normal.  She is also scheduled for another stress test later this month.   Shortness of Breath Associated symptoms: chest pain and cough   Associated symptoms: no fever        Home Medications Prior to Admission medications   Medication Sig Start Date End Date Taking? Authorizing Provider  allopurinol (ZYLOPRIM) 100 MG tablet Take 100 mg by mouth daily.    [provider]  B Complex Vitamins (B-COMPLEX/B-12 PO) Take by mouth.      [provider]  benzonatate (TESSALON) 100 MG capsule Take 1 capsule (100 mg total) by mouth every 8 (eight) hours. 09/17/14   [name], MD  clopidogrel (PLAVIX) 75 MG tablet Take 75 mg by mouth daily.      [provider]  ergocalciferol (VITAMIN D2) 50000 UNITS capsule Take 50,000 Units by mouth once a week.    [provider]  furosemide (LASIX) 40 MG tablet Take 1 tablet (40 mg total) by mouth daily. 01/02/20   Horton, Barbette Hair, MD   hydrochlorothiazide (HYDRODIURIL) 25 MG tablet Take 25 mg by mouth daily.      [provider]  HYDROcodone-acetaminophen (NORCO/VICODIN) 5-325 MG tablet Take 1-2 tablets by mouth every 6 (six) hours as needed. 06/26/19   Volanda Napoleon, PA-C  levofloxacin (LEVAQUIN) 500 MG tablet Take 500 mg by mouth daily.    [provider]  lidocaine (LIDODERM) 5 % Place 1 patch onto the skin daily. Remove & Discard patch within 12 hours or as directed by MD 06/26/19   Volanda Napoleon, PA-C  lisinopril (PRINIVIL,ZESTRIL) 40 MG tablet Take 40 mg by mouth daily.      [provider]  metFORMIN (GLUCOPHAGE) 500 MG tablet Take by mouth 2 (two) times daily with a meal.    [provider]  methocarbamol (ROBAXIN) 500 MG tablet Take 1 tablet (500 mg total) by mouth every 8 (eight) hours as needed for muscle spasms. 03/29/22   Johny Blamer, DO  metroNIDAZOLE (FLAGYL) 500 MG tablet Take 500 mg by mouth 3 (three) times daily.    [provider]  omeprazole (PRILOSEC) 20 MG capsule Take 1 capsule (20 mg total) by mouth 2 (two) times daily. 03/04/17   Tanna Furry, MD  ondansetron (ZOFRAN ODT) 4 MG disintegrating tablet Take 1 tablet (4 mg total) by mouth every 8 (eight) hours as needed for nausea. 09/17/14   [name], MD  oxyCODONE (OXYCONTIN) 10 MG 12  hr tablet Take 10 mg by mouth every 4 (four) hours as needed.    [provider]  pravastatin (PRAVACHOL) 20 MG tablet Take 10 mg by mouth daily.     [provider]  predniSONE (STERAPRED UNI-PAK 21 TAB) 10 MG (21) TBPK tablet Take by mouth daily. Take 6 tabs by mouth daily  for 2 days, then 5 tabs for 2 days, then 4 tabs for 2 days, then 3 tabs for 2 days, 2 tabs for 2 days, then 1 tab by mouth daily for 2 days 08/30/18   Frederica Kuster, PA-C      Allergies    Hydrocodone-acetaminophen    Review of Systems   Review of Systems  Constitutional:  Positive for fatigue. Negative for chills and fever.   Respiratory:  Positive for cough and shortness of breath. Negative for apnea.   Cardiovascular:  Positive for chest pain and leg swelling.  Musculoskeletal:  Positive for back pain.  Neurological:  Negative for syncope, facial asymmetry and light-headedness.    Physical Exam Updated Vital Signs BP (!) 175/91   Pulse 67   Temp 98.2 F (36.8 C) (Oral)   Resp 16   Ht '5\' 2"'$  (1.575 m)   Wt 136.1 kg   SpO2 98%   BMI 54.87 kg/m  Physical Exam Vitals reviewed.  Constitutional:      General: She is not in acute distress. HENT:     Mouth/Throat:     Mouth: Mucous membranes are moist.     Pharynx: Oropharynx is clear. No pharyngeal swelling or oropharyngeal exudate.  Neck:     Comments: JVD difficult to evaluate due to body habitus Cardiovascular:     Rate and Rhythm: Normal rate and regular rhythm.     Pulses:          Radial pulses are 2+ on the right side and 2+ on the left side.     Heart sounds:     Gallop present. S3 sounds present.  Pulmonary:     Effort: Pulmonary effort is normal. No accessory muscle usage or respiratory distress.     Breath sounds: Examination of the right-middle field reveals rales. Examination of the left-middle field reveals rales. Examination of the right-lower field reveals rales. Examination of the left-lower field reveals rales. Rales present.  Abdominal:     Palpations: Abdomen is soft.     Tenderness: There is abdominal tenderness (Tenderness to palpation over the right abdomen with greater tenderness in the lower right than the upper right).  Musculoskeletal:     Cervical back: Normal range of motion.     Right lower leg: 3+ Pitting Edema present.     Left lower leg: 3+ Pitting Edema present.  Neurological:     Mental Status: She is alert.     ED Results / Procedures / Treatments   Labs (all labs ordered are listed, but only abnormal results are displayed) Labs Reviewed  CBC WITH DIFFERENTIAL/PLATELET - Abnormal; Notable for the  following components:      Result Value   WBC 10.6 (*)    Hemoglobin 11.7 (*)    MCV 78.0 (*)    MCH 24.5 (*)    RDW 17.7 (*)    All other components within normal limits  BASIC METABOLIC PANEL - Abnormal; Notable for the following components:   Glucose, Bld 102 (*)    Creatinine, Ser 1.19 (*)    GFR, Estimated 48 (*)    All other components within  normal limits  BRAIN NATRIURETIC PEPTIDE - Abnormal; Notable for the following components:   B Natriuretic Peptide 426.9 (*)    All other components within normal limits  TROPONIN I (HIGH SENSITIVITY)  TROPONIN I (HIGH SENSITIVITY)    EKG None  Radiology DG Chest 2 View  Result Date: 03/29/2022 CLINICAL DATA:  Shortness of breath. EXAM: CHEST - 2 VIEW COMPARISON:  01/02/2022. FINDINGS: The heart is enlarged the mediastinal contour is stable. Atherosclerotic calcification of the aorta is noted. The pulmonary vasculature is distended and mild perihilar interstitial thickening is noted bilaterally. No consolidation, effusion, or pneumothorax. Degenerative changes are present in the thoracic spine. IMPRESSION: 1. Cardiomegaly with pulmonary vascular congestion. 2. Perihilar interstitial opacities bilaterally suggesting edema. Electronically Signed   By: Brett Fairy M.D.   On: 03/29/2022 20:29    Procedures Procedures    Medications Ordered in ED Medications  fentaNYL (SUBLIMAZE) 50 MCG/ML injection (  Not Given 03/29/22 2236)  furosemide (LASIX) injection 40 mg (40 mg Intravenous Given 03/29/22 2110)  fentaNYL (SUBLIMAZE) injection 50 mcg (50 mcg Intravenous Given 03/29/22 2228)  methocarbamol (ROBAXIN) tablet 500 mg (500 mg Oral Given 03/29/22 2229)    ED Course/ Medical Decision Making/ A&P                           Medical Decision Making Samoria Fedorko is a 76 year old female with a past medical history of hypertension, diabetes, and chronic kidney disease who presents with 1 month of worsening orthopnea, dyspnea on exertion, and  lower extremity edema.  Is most likely due to a mild exacerbation of her underlying congestive heart failure or inadequate diuresis with her current regimen.  Chest x-ray, EKG, troponin x2 showed no evidence of cardiac ischemia or acute arrhythmia or any changes from prior EKG.  Renal function was stable from prior lab values.  There were no electrolyte abnormalities or signs of infection.  Her BMP was 426.  She was treated with IV Lasix 40 mg and felt better after urinating over 400 mL.  She developed a cramp in her left calf to discharge and was treated with IV fentanyl and Robaxin.  She was counseled on how to avoid cramps while using Lasix and all questions were answered prior to discharge.  We instructed her to increase her home Lasix dose from 40 mg daily to 40 mg twice daily until she follows up with her primary care physician on July 25.  She has enough 40 mg Lasix pills at home to not need a new prescription at this time.  She was in stable condition when discharged home with plan to contact both her primary care physician and cardiologist for close follow-up this coming week in addition to her primary care visit on July 25 and planned stress test on July 27.  Problems Addressed: Congestive heart failure, unspecified HF chronicity, unspecified heart failure type [facility]): chronic illness or injury with exacerbation, progression, or side effects of treatment Cramp in lower leg: acute illness or injury  Amount and/or Complexity of Data Reviewed Labs: ordered. Decision-making details documented in ED Course. Radiology: ordered and independent interpretation performed. Decision-making details documented in ED Course. ECG/medicine tests: ordered and independent interpretation performed. Decision-making details documented in ED Course.  Risk Prescription drug management.          Final Clinical Impression(s) / ED Diagnoses Final diagnoses:  Congestive heart failure, unspecified HF chronicity,  unspecified heart failure type (Felt)  Cramp in  lower leg    Rx / DC Orders ED Discharge Orders          Ordered    methocarbamol (ROBAXIN-750) 750 MG tablet  4 times daily,   Status:  Discontinued        03/29/22 2242    methocarbamol (ROBAXIN) 500 MG tablet  Every 8 hours PRN        03/29/22 2245              Johny Blamer, DO 03/29/22 2254    Lucrezia Starch, MD 03/30/22 1534

## 2022-03-29 NOTE — ED Triage Notes (Addendum)
Pt reports SHOB for a while, worse tonight; hx of "fluid in my lungs"; O2 sats 100% on RA; pt noted to have irregular, intermittent gasping breaths, but does not appear to be in distress

## 2022-03-30 ENCOUNTER — Emergency Department (HOSPITAL_BASED_OUTPATIENT_CLINIC_OR_DEPARTMENT_OTHER): Payer: Medicare Other

## 2022-03-30 ENCOUNTER — Other Ambulatory Visit: Payer: Self-pay

## 2022-03-30 ENCOUNTER — Emergency Department (HOSPITAL_COMMUNITY): Payer: Medicare Other

## 2022-03-30 ENCOUNTER — Emergency Department (HOSPITAL_BASED_OUTPATIENT_CLINIC_OR_DEPARTMENT_OTHER)
Admission: EM | Admit: 2022-03-30 | Discharge: 2022-03-30 | Disposition: A | Discharge status: Home / Self Care | Payer: Medicare Other | Attending: Emergency Medicine | Admitting: Emergency Medicine

## 2022-03-30 ENCOUNTER — Encounter (HOSPITAL_BASED_OUTPATIENT_CLINIC_OR_DEPARTMENT_OTHER): Payer: Self-pay | Admitting: Emergency Medicine

## 2022-03-30 DIAGNOSIS — R791 Abnormal coagulation profile: Secondary | ICD-10-CM | POA: Insufficient documentation

## 2022-03-30 DIAGNOSIS — R0602 Shortness of breath: Secondary | ICD-10-CM | POA: Diagnosis not present

## 2022-03-30 DIAGNOSIS — I509 Heart failure, unspecified: Secondary | ICD-10-CM | POA: Diagnosis not present

## 2022-03-30 DIAGNOSIS — R0789 Other chest pain: Secondary | ICD-10-CM | POA: Insufficient documentation

## 2022-03-30 DIAGNOSIS — Z7902 Long term (current) use of antithrombotics/antiplatelets: Secondary | ICD-10-CM | POA: Insufficient documentation

## 2022-03-30 DIAGNOSIS — R079 Chest pain, unspecified: Secondary | ICD-10-CM

## 2022-03-30 LAB — BASIC METABOLIC PANEL
Anion gap: 10 (ref 5–15)
BUN: 22 mg/dL (ref 8–23)
CO2: 25 mmol/L (ref 22–32)
Calcium: 9.5 mg/dL (ref 8.9–10.3)
Chloride: 103 mmol/L (ref 98–111)
Creatinine, Ser: 1.27 mg/dL — ABNORMAL HIGH (ref 0.44–1.00)
GFR, Estimated: 45 mL/min — ABNORMAL LOW (ref >60)
Glucose, Bld: 106 mg/dL — ABNORMAL HIGH (ref 70–99)
Potassium: 3.4 mmol/L — ABNORMAL LOW (ref 3.5–5.1)
Sodium: 138 mmol/L (ref 135–145)

## 2022-03-30 LAB — CBC
HCT: 40.5 % (ref 36.0–46.0)
Hemoglobin: 12.7 g/dL (ref 12.0–15.0)
MCH: 24.4 pg — ABNORMAL LOW (ref 26.0–34.0)
MCHC: 31.4 g/dL (ref 30.0–36.0)
MCV: 77.9 fL — ABNORMAL LOW (ref 80.0–100.0)
Platelets: 287 10*3/uL (ref 150–400)
RBC: 5.2 MIL/uL — ABNORMAL HIGH (ref 3.87–5.11)
RDW: 18.2 % — ABNORMAL HIGH (ref 11.5–15.5)
WBC: 11.3 10*3/uL — ABNORMAL HIGH (ref 4.0–10.5)
nRBC: 0 % (ref 0.0–0.2)

## 2022-03-30 LAB — TROPONIN I (HIGH SENSITIVITY)
Troponin I (High Sensitivity): 12 ng/L (ref <18)
Troponin I (High Sensitivity): 12 ng/L (ref <18)
Troponin I (High Sensitivity): 11 ng/L

## 2022-03-30 LAB — D-DIMER, QUANTITATIVE: D-Dimer, Quant: 1.05 ug/mL-FEU — ABNORMAL HIGH (ref 0.00–0.50)

## 2022-03-30 MED ORDER — POTASSIUM CHLORIDE CRYS ER 20 MEQ PO TBCR
40.0000 meq | EXTENDED_RELEASE_TABLET | Freq: Once | ORAL | Status: AC
  Administered 2022-03-30: 40 meq via ORAL
  Filled 2022-03-30: qty 2

## 2022-03-30 MED ORDER — FUROSEMIDE 10 MG/ML IJ SOLN
40.0000 mg | Freq: Once | INTRAMUSCULAR | Status: AC
Start: 2022-03-30 — End: 2022-03-30
  Administered 2022-03-30: 40 mg via INTRAVENOUS
  Filled 2022-03-30: qty 4

## 2022-03-30 MED ORDER — IOHEXOL 350 MG/ML SOLN
100.0000 mL | Freq: Once | INTRAVENOUS | Status: AC | PRN
  Administered 2022-03-30: 80 mL via INTRAVENOUS

## 2022-03-30 MED ORDER — SODIUM CHLORIDE (PF) 0.9 % IJ SOLN
INTRAMUSCULAR | Status: AC
  Filled 2022-03-30: qty 50

## 2022-03-30 NOTE — Discharge Instructions (Signed)
Your testing is negative for heart attack or blood clot in the lung.  As we discussed you likely have some heart failure causing your congestion, difficulty breathing.  Continue your Lasix as prescribed 40 mg twice daily for the next 2 days and follow-up with your cardiologist this week for stress test.  Return to the ED with exertional chest pain, worsening shortness of breath, cough, fever, other concerns

## 2022-03-30 NOTE — ED Triage Notes (Signed)
PT c/o LT CP x 1 episode today; was seen here last pm for [facility]; denies pain at this time

## 2022-03-30 NOTE — ED Provider Notes (Signed)
[facility] EMERGENCY DEPARTMENT Provider Note   CSN: [id number] Arrival date & time: 03/30/22  1724     History  Chief Complaint  Patient presents with   Chest Pain    Holly Perkins is a 76 y.o. female.  Presented to the emergency room due to concern for chest pain.  Patient was seen in the emergency room yesterday for shortness of breath.  She states that since going home last night she has not had any recurrent shortness of breath, no difficulty breathing at all today.  The episode of chest pain happened about 5:00 this evening.  Was very intense, up to 10 out of 10 in severity.  Has since completely dissipated and currently has no ongoing symptoms at this time.  Did not radiate to her back, not associated with diaphoresis or nausea.  Pressure sensation.  HPI     Home Medications Prior to Admission medications   Medication Sig Start Date End Date Taking? Authorizing Provider  allopurinol (ZYLOPRIM) 100 MG tablet Take 100 mg by mouth daily.    [provider]  B Complex Vitamins (B-COMPLEX/B-12 PO) Take by mouth.      [provider]  benzonatate (TESSALON) 100 MG capsule Take 1 capsule (100 mg total) by mouth every 8 (eight) hours. 09/17/14   [name], MD  clopidogrel (PLAVIX) 75 MG tablet Take 75 mg by mouth daily.      [provider]  ergocalciferol (VITAMIN D2) 50000 UNITS capsule Take 50,000 Units by mouth once a week.    [provider]  furosemide (LASIX) 40 MG tablet Take 1 tablet (40 mg total) by mouth daily. 01/02/20   Horton, Barbette Hair, MD  hydrochlorothiazide (HYDRODIURIL) 25 MG tablet Take 25 mg by mouth daily.      [provider]  HYDROcodone-acetaminophen (NORCO/VICODIN) 5-325 MG tablet Take 1-2 tablets by mouth every 6 (six) hours as needed. 06/26/19   Volanda Napoleon, PA-C  levofloxacin (LEVAQUIN) 500 MG tablet Take 500 mg by mouth daily.    [provider]  lidocaine (LIDODERM) 5 % Place 1  patch onto the skin daily. Remove & Discard patch within 12 hours or as directed by MD 06/26/19   Volanda Napoleon, PA-C  lisinopril (PRINIVIL,ZESTRIL) 40 MG tablet Take 40 mg by mouth daily.      [provider]  metFORMIN (GLUCOPHAGE) 500 MG tablet Take by mouth 2 (two) times daily with a meal.    [provider]  methocarbamol (ROBAXIN) 500 MG tablet Take 1 tablet (500 mg total) by mouth every 8 (eight) hours as needed for muscle spasms. 03/29/22   Johny Blamer, DO  metroNIDAZOLE (FLAGYL) 500 MG tablet Take 500 mg by mouth 3 (three) times daily.    [provider]  omeprazole (PRILOSEC) 20 MG capsule Take 1 capsule (20 mg total) by mouth 2 (two) times daily. 03/04/17   Tanna Furry, MD  ondansetron (ZOFRAN ODT) 4 MG disintegrating tablet Take 1 tablet (4 mg total) by mouth every 8 (eight) hours as needed for nausea. 09/17/14   [name], MD  oxyCODONE (OXYCONTIN) 10 MG 12 hr tablet Take 10 mg by mouth every 4 (four) hours as needed.    [provider]  pravastatin (PRAVACHOL) 20 MG tablet Take 10 mg by mouth daily.     [provider]  predniSONE (STERAPRED UNI-PAK 21 TAB) 10 MG (21) TBPK tablet Take by mouth daily. Take 6 tabs by mouth daily  for 2 days,  then 5 tabs for 2 days, then 4 tabs for 2 days, then 3 tabs for 2 days, 2 tabs for 2 days, then 1 tab by mouth daily for 2 days 08/30/18   Frederica Kuster, PA-C      Allergies    Hydrocodone-acetaminophen    Review of Systems   Review of Systems  Cardiovascular:  Positive for chest pain.  All other systems reviewed and are negative.   Physical Exam Updated Vital Signs BP (!) 164/83   Pulse 75   Temp 98.1 F (36.7 C) (Oral)   Resp 20   Ht '5\' 2"'$  (1.575 m)   Wt 136.1 kg   SpO2 97%   BMI 54.87 kg/m  Physical Exam Vitals and nursing note reviewed.  Constitutional:      General: She is not in acute distress.    Appearance: She is well-developed.  HENT:     Head: Normocephalic and  atraumatic.  Eyes:     Conjunctiva/sclera: Conjunctivae normal.  Cardiovascular:     Rate and Rhythm: Normal rate and regular rhythm.     Heart sounds: No murmur heard. Pulmonary:     Effort: Pulmonary effort is normal. No respiratory distress.     Breath sounds: Normal breath sounds.  Abdominal:     Palpations: Abdomen is soft.     Tenderness: There is no abdominal tenderness.  Musculoskeletal:        General: No swelling.     Cervical back: Neck supple.  Skin:    General: Skin is warm and dry.     Capillary Refill: Capillary refill takes less than 2 seconds.  Neurological:     Mental Status: She is alert.  Psychiatric:        Mood and Affect: Mood normal.     ED Results / Procedures / Treatments   Labs (all labs ordered are listed, but only abnormal results are displayed) Labs Reviewed  BASIC METABOLIC PANEL - Abnormal; Notable for the following components:      Result Value   Potassium 3.4 (*)    Glucose, Bld 106 (*)    Creatinine, Ser 1.27 (*)    GFR, Estimated 45 (*)    All other components within normal limits  CBC - Abnormal; Notable for the following components:   WBC 11.3 (*)    RBC 5.20 (*)    MCV 77.9 (*)    MCH 24.4 (*)    RDW 18.2 (*)    All other components within normal limits  D-DIMER, QUANTITATIVE (NOT AT [facility]) - Abnormal; Notable for the following components:   D-Dimer, Quant 1.05 (*)    All other components within normal limits  TROPONIN I (HIGH SENSITIVITY)  TROPONIN I (HIGH SENSITIVITY)    EKG None  Radiology DG Chest 2 View  Result Date: 03/30/2022 CLINICAL DATA:  LT CP x 1 episode today; was seen here last pm for [facility]. Htn, states her diab is untreated. EXAM: CHEST - 2 VIEW COMPARISON:  03/29/2022.  CT, 01/27/2020. FINDINGS: Cardiac silhouette is top-normal in size. No mediastinal or hilar masses. No evidence of adenopathy. Clear lungs.  No pleural effusion or pneumothorax. Skeletal structures are intact. IMPRESSION: No active  cardiopulmonary disease. Electronically Signed   By: Lajean Manes M.D.   On: 03/30/2022 18:07   DG Chest 2 View  Result Date: 03/29/2022 CLINICAL DATA:  Shortness of breath. EXAM: CHEST - 2 VIEW COMPARISON:  01/02/2022. FINDINGS: The heart is enlarged the mediastinal contour is stable. Atherosclerotic  calcification of the aorta is noted. The pulmonary vasculature is distended and mild perihilar interstitial thickening is noted bilaterally. No consolidation, effusion, or pneumothorax. Degenerative changes are present in the thoracic spine. IMPRESSION: 1. Cardiomegaly with pulmonary vascular congestion. 2. Perihilar interstitial opacities bilaterally suggesting edema. Electronically Signed   By: Brett Fairy M.D.   On: 03/29/2022 20:29    Procedures Procedures    Medications Ordered in ED Medications - No data to display  ED Course/ Medical Decision Making/ A&P                           Medical Decision Making Amount and/or Complexity of Data Reviewed Labs: ordered. Radiology: ordered.   76 year old lady presenting to ER due to concern for chest pain.  She was seen in the ER last night for shortness of breath and at that time felt to have mild CHF exacerbation, doing better after getting dose of IV Lasix.  Today no difficulty breathing but had new episode of chest pain.  In ER her pain had resolved.  Her EKG does not demonstrate any acute ischemic change and her initial troponin is normal, doubt ACS.  We will send second troponin given pain started just prior to arrival in ER.  No anemia, no electrolyte derangement.  CXR per my review does not have any edema or vascular congestion.  D-dimer is mildly elevated.  No CT scan available at [facility] at this time.  I discussed with Dr. Almyra Free at [facility] who will accept pt as transfer ER to ER.  Patient would like to go with her sister at bedside.  Offered transport.  She is hemodynamically stable and well-appearing without any ongoing symptoms,  feel she is stable for transfer POV at this time.  Instructed to go directly to [facility] emergency room.  If her CT is negative and repeat troponin is negative and she remains asymptomatic I anticipate she likely will be appropriate for discharge.  She reports having appointments with her primary care doctor, pulmonology as well as a stress test scheduled with her cardiologist on Thursday of this coming week.        Final Clinical Impression(s) / ED Diagnoses Final diagnoses:  Chest pain, unspecified type    Rx / DC Orders ED Discharge Orders     None         Lucrezia Starch, MD 03/30/22 2031

## 2022-03-30 NOTE — ED Provider Notes (Signed)
Patient sent from outside facility for CT chest.  She presented with episode of left-sided chest pain earlier today that lasted about 30 minutes and has since resolved.  She was seen 2 days ago for shortness of breath and probably had a mild CHF exacerbation.  There is pain to palpation beneath her left breast which is reproducible. Troponin is negative x2.  She is scheduled to see her cardiologist for stress test later this week. Troponin remains negative.  Patient able to ambulate without desaturation.  Instructed to take her Lasix as prescribed and follow-up with her cardiologist as scheduled this week.  Discussed she is low risk for coronary disease but nonzero risk.  Recommend stress test as scheduled.  Return to the ED with exertional chest pain, shortness of breath, nausea, vomiting, sweating, other concerns   Ezequiel Essex, MD 03/30/22 2317

## 2022-03-30 NOTE — ED Notes (Signed)
Report called to [facility], Rn at [facility] , pt to transfer ED to ED by POV, sister to drive, IV secured prior to pt leaving. Pt stable for transfer.

## 2022-03-30 NOTE — ED Notes (Signed)
ED Provider at bedside. 

## 2022-04-01 ENCOUNTER — Institutional Professional Consult (permissible substitution): Payer: Medicare Other | Admitting: Internal Medicine

## 2022-04-01 NOTE — Progress Notes (Deleted)
Holly Perkins, female    DOB: 23-Sep-1945   MRN: [id number]   Brief patient profile:  ***  *** referred to pulmonary clinic 04/01/2022 by *** for ***        History of Present Illness  04/01/2022  Pulmonary/ 1st office eval/Netra Postlethwait on ACEi  No chief complaint on file.    Dyspnea:  *** Cough: *** Sleep: *** SABA use:   Past Medical History:  Diagnosis Date   Chest pain, atypical    Chronic renal failure    stage 3   Diabetes mellitus without complication (HCC)    Diverticulitis    High cholesterol    Hyperlipidemia    Hypertension    Peripheral vascular disease (HCC)     Outpatient Medications Prior to Visit  Medication Sig Dispense Refill   allopurinol (ZYLOPRIM) 100 MG tablet Take 100 mg by mouth daily.     B Complex Vitamins (B-COMPLEX/B-12 PO) Take by mouth.       benzonatate (TESSALON) 100 MG capsule Take 1 capsule (100 mg total) by mouth every 8 (eight) hours. 21 capsule 0   clopidogrel (PLAVIX) 75 MG tablet Take 75 mg by mouth daily.       ergocalciferol (VITAMIN D2) 50000 UNITS capsule Take 50,000 Units by mouth once a week.     furosemide (LASIX) 40 MG tablet Take 1 tablet (40 mg total) by mouth daily. 5 tablet 0   hydrochlorothiazide (HYDRODIURIL) 25 MG tablet Take 25 mg by mouth daily.       HYDROcodone-acetaminophen (NORCO/VICODIN) 5-325 MG tablet Take 1-2 tablets by mouth every 6 (six) hours as needed. 6 tablet 0   levofloxacin (LEVAQUIN) 500 MG tablet Take 500 mg by mouth daily.     lidocaine (LIDODERM) 5 % Place 1 patch onto the skin daily. Remove & Discard patch within 12 hours or as directed by MD 5 patch 0   lisinopril (PRINIVIL,ZESTRIL) 40 MG tablet Take 40 mg by mouth daily.       metFORMIN (GLUCOPHAGE) 500 MG tablet Take by mouth 2 (two) times daily with a meal.     methocarbamol (ROBAXIN) 500 MG tablet Take 1 tablet (500 mg total) by mouth every 8 (eight) hours as needed for muscle spasms. 12 tablet 0   metroNIDAZOLE (FLAGYL) 500 MG tablet Take 500 mg  by mouth 3 (three) times daily.     omeprazole (PRILOSEC) 20 MG capsule Take 1 capsule (20 mg total) by mouth 2 (two) times daily. 60 capsule 1   ondansetron (ZOFRAN ODT) 4 MG disintegrating tablet Take 1 tablet (4 mg total) by mouth every 8 (eight) hours as needed for nausea. 10 tablet 0   oxyCODONE (OXYCONTIN) 10 MG 12 hr tablet Take 10 mg by mouth every 4 (four) hours as needed.     pravastatin (PRAVACHOL) 20 MG tablet Take 10 mg by mouth daily.      predniSONE (STERAPRED UNI-PAK 21 TAB) 10 MG (21) TBPK tablet Take by mouth daily. Take 6 tabs by mouth daily  for 2 days, then 5 tabs for 2 days, then 4 tabs for 2 days, then 3 tabs for 2 days, 2 tabs for 2 days, then 1 tab by mouth daily for 2 days 42 tablet 0   No facility-administered medications prior to visit.     Objective:     There were no vitals taken for this visit.         I personally reviewed images and agree with radiology impression as follows:  Chest CTa  7/23 23 1. No evidence of significant pulmonary embolus. 2. Patchy airspace disease in the lungs likely representing edema. Multifocal pneumonia would also be a possibility. 3. 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 No change since prior study. Not clinically significant; no follow-up imaging recommended Assessment   No problem-specific Assessment & Plan notes found for this encounter.     Christinia Gully, MD 04/01/2022

## 2022-04-02 ENCOUNTER — Institutional Professional Consult (permissible substitution): Payer: Medicare Other | Admitting: Pulmonary Disease

## 2022-04-02 NOTE — Progress Notes (Deleted)
Synopsis: Referred in July 2023 for COPD  Subjective:   PATIENT ID: Holly Perkins GENDER: female DOB: 10/24/1945, MRN: [id number]   HPI  No chief complaint on file.  Holly Perkins is a 76 year old woman, with DMII, hypertension, peripheral vascular disease and CKDIII who is referred to pulmonary clinic for COPD evaluation.    Past Medical History:  Diagnosis Date   Chest pain, atypical    Chronic renal failure    stage 3   Diabetes mellitus without complication (HCC)    Diverticulitis    High cholesterol    Hyperlipidemia    Hypertension    Peripheral vascular disease ([place])      No family history on file.   Social History   Socioeconomic History   Marital status: Single    Spouse name: Not on file   Number of children: Not on file   Years of education: Not on file   Highest education level: Not on file  Occupational History   Not on file  Tobacco Use   Smoking status: Former    Types: Cigarettes    Quit date: 09/08/2008    Years since quitting: 13.5   Smokeless tobacco: Never  Substance and Sexual Activity   Alcohol use: No   Drug use: No   Sexual activity: Not on file  Other Topics Concern   Not on file  Social History Narrative   Not on file   Social Determinants of Health   Financial Resource Strain: Not on file  Food Insecurity: Not on file  Transportation Needs: Not on file  Physical Activity: Not on file  Stress: Not on file  Social Connections: Not on file  Intimate Partner Violence: Not on file     Allergies  Allergen Reactions   Hydrocodone-Acetaminophen Nausea Only     Outpatient Medications Prior to Visit  Medication Sig Dispense Refill   allopurinol (ZYLOPRIM) 100 MG tablet Take 100 mg by mouth daily.     B Complex Vitamins (B-COMPLEX/B-12 PO) Take by mouth.       benzonatate (TESSALON) 100 MG capsule Take 1 capsule (100 mg total) by mouth every 8 (eight) hours. 21 capsule 0   clopidogrel (PLAVIX) 75 MG tablet Take 75 mg by  mouth daily.       ergocalciferol (VITAMIN D2) 50000 UNITS capsule Take 50,000 Units by mouth once a week.     furosemide (LASIX) 40 MG tablet Take 1 tablet (40 mg total) by mouth daily. 5 tablet 0   hydrochlorothiazide (HYDRODIURIL) 25 MG tablet Take 25 mg by mouth daily.       HYDROcodone-acetaminophen (NORCO/VICODIN) 5-325 MG tablet Take 1-2 tablets by mouth every 6 (six) hours as needed. 6 tablet 0   levofloxacin (LEVAQUIN) 500 MG tablet Take 500 mg by mouth daily.     lidocaine (LIDODERM) 5 % Place 1 patch onto the skin daily. Remove & Discard patch within 12 hours or as directed by MD 5 patch 0   lisinopril (PRINIVIL,ZESTRIL) 40 MG tablet Take 40 mg by mouth daily.       metFORMIN (GLUCOPHAGE) 500 MG tablet Take by mouth 2 (two) times daily with a meal.     methocarbamol (ROBAXIN) 500 MG tablet Take 1 tablet (500 mg total) by mouth every 8 (eight) hours as needed for muscle spasms. 12 tablet 0   metroNIDAZOLE (FLAGYL) 500 MG tablet Take 500 mg by mouth 3 (three) times daily.     omeprazole (PRILOSEC) 20 MG capsule Take  1 capsule (20 mg total) by mouth 2 (two) times daily. 60 capsule 1   ondansetron (ZOFRAN ODT) 4 MG disintegrating tablet Take 1 tablet (4 mg total) by mouth every 8 (eight) hours as needed for nausea. 10 tablet 0   oxyCODONE (OXYCONTIN) 10 MG 12 hr tablet Take 10 mg by mouth every 4 (four) hours as needed.     pravastatin (PRAVACHOL) 20 MG tablet Take 10 mg by mouth daily.      predniSONE (STERAPRED UNI-PAK 21 TAB) 10 MG (21) TBPK tablet Take by mouth daily. Take 6 tabs by mouth daily  for 2 days, then 5 tabs for 2 days, then 4 tabs for 2 days, then 3 tabs for 2 days, 2 tabs for 2 days, then 1 tab by mouth daily for 2 days 42 tablet 0   No facility-administered medications prior to visit.    Review of Systems  Constitutional:  Negative for chills, fever, malaise/fatigue and weight loss.  HENT:  Negative for congestion, sinus pain and sore throat.   Eyes: Negative.    Respiratory:  Negative for cough, hemoptysis, sputum production, shortness of breath and wheezing.   Cardiovascular:  Negative for chest pain, palpitations, orthopnea, claudication and leg swelling.  Gastrointestinal:  Negative for abdominal pain, heartburn, nausea and vomiting.  Genitourinary: Negative.   Musculoskeletal:  Negative for joint pain and myalgias.  Skin:  Negative for rash.  Neurological:  Negative for weakness.  Endo/Heme/Allergies: Negative.   Psychiatric/Behavioral: Negative.     Objective:  There were no vitals filed for this visit.  Physical Exam Constitutional:      General: She is not in acute distress.    Appearance: She is not ill-appearing.  HENT:     Head: Normocephalic and atraumatic.  Eyes:     General: No scleral icterus.    Conjunctiva/sclera: Conjunctivae normal.     Pupils: Pupils are equal, round, and reactive to light.  Cardiovascular:     Rate and Rhythm: Normal rate and regular rhythm.     Pulses: Normal pulses.     Heart sounds: Normal heart sounds. No murmur heard. Pulmonary:     Effort: Pulmonary effort is normal.     Breath sounds: Normal breath sounds. No wheezing, rhonchi or rales.  Abdominal:     General: Bowel sounds are normal.     Palpations: Abdomen is soft.  Musculoskeletal:     Right lower leg: No edema.     Left lower leg: No edema.  Lymphadenopathy:     Cervical: No cervical adenopathy.  Skin:    General: Skin is warm and dry.  Neurological:     General: No focal deficit present.     Mental Status: She is alert.  Psychiatric:        Mood and Affect: Mood normal.        Behavior: Behavior normal.        Thought Content: Thought content normal.        Judgment: Judgment normal.    CBC    Component Value Date/Time   WBC 11.3 (H) 03/30/2022 1810   RBC 5.20 (H) 03/30/2022 1810   HGB 12.7 03/30/2022 1810   HCT 40.5 03/30/2022 1810   PLT 287 03/30/2022 1810   MCV 77.9 (L) 03/30/2022 1810   MCH 24.4 (L) 03/30/2022  1810   MCHC 31.4 03/30/2022 1810   RDW 18.2 (H) 03/30/2022 1810   LYMPHSABS 2.2 03/29/2022 2102   MONOABS 0.9 03/29/2022 2102   EOSABS 0.3  03/29/2022 2102   BASOSABS 0.1 03/29/2022 2102      Latest Ref Rng & Units 03/30/2022    6:10 PM 03/29/2022    9:02 PM 07/28/2021    5:31 PM  BMP  Glucose 70 - 99 mg/dL 106  102  106   BUN 8 - 23 mg/dL '22  20  24   '$ Creatinine 0.44 - 1.00 mg/dL 1.27  1.19  1.25   Sodium 135 - 145 mmol/L 138  140  138   Potassium 3.5 - 5.1 mmol/L 3.4  4.1  3.5   Chloride 98 - 111 mmol/L 103  109  105   CO2 22 - 32 mmol/L '25  25  22   '$ Calcium 8.9 - 10.3 mg/dL 9.5  9.2  9.1    Chest imaging: CT Chest 03/30/22 1. No evidence of significant pulmonary embolus. 2. Patchy airspace disease in the lungs likely representing edema. Multifocal pneumonia would also be a possibility. 3. 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 No change since prior study. Not clinically significant; no follow-up imaging recommended  PFT:     No data to display          Labs:  Path:  Echo:  Heart Catheterization:  Assessment & Plan:   No diagnosis found.  Discussion: ***    Current Outpatient Medications:    allopurinol (ZYLOPRIM) 100 MG tablet, Take 100 mg by mouth daily., Disp: , Rfl:    B Complex Vitamins (B-COMPLEX/B-12 PO), Take by mouth.  , Disp: , Rfl:    benzonatate (TESSALON) 100 MG capsule, Take 1 capsule (100 mg total) by mouth every 8 (eight) hours., Disp: 21 capsule, Rfl: 0   clopidogrel (PLAVIX) 75 MG tablet, Take 75 mg by mouth daily.  , Disp: , Rfl:    ergocalciferol (VITAMIN D2) 50000 UNITS capsule, Take 50,000 Units by mouth once a week., Disp: , Rfl:    furosemide (LASIX) 40 MG tablet, Take 1 tablet (40 mg total) by mouth daily., Disp: 5 tablet, Rfl: 0   hydrochlorothiazide (HYDRODIURIL) 25 MG tablet, Take 25 mg by mouth daily.  , Disp: , Rfl:    HYDROcodone-acetaminophen (NORCO/VICODIN) 5-325 MG tablet, Take 1-2 tablets by mouth every 6 (six) hours  as needed., Disp: 6 tablet, Rfl: 0   levofloxacin (LEVAQUIN) 500 MG tablet, Take 500 mg by mouth daily., Disp: , Rfl:    lidocaine (LIDODERM) 5 %, Place 1 patch onto the skin daily. Remove & Discard patch within 12 hours or as directed by MD, Disp: 5 patch, Rfl: 0   lisinopril (PRINIVIL,ZESTRIL) 40 MG tablet, Take 40 mg by mouth daily.  , Disp: , Rfl:    metFORMIN (GLUCOPHAGE) 500 MG tablet, Take by mouth 2 (two) times daily with a meal., Disp: , Rfl:    methocarbamol (ROBAXIN) 500 MG tablet, Take 1 tablet (500 mg total) by mouth every 8 (eight) hours as needed for muscle spasms., Disp: 12 tablet, Rfl: 0   metroNIDAZOLE (FLAGYL) 500 MG tablet, Take 500 mg by mouth 3 (three) times daily., Disp: , Rfl:    omeprazole (PRILOSEC) 20 MG capsule, Take 1 capsule (20 mg total) by mouth 2 (two) times daily., Disp: 60 capsule, Rfl: 1   ondansetron (ZOFRAN ODT) 4 MG disintegrating tablet, Take 1 tablet (4 mg total) by mouth every 8 (eight) hours as needed for nausea., Disp: 10 tablet, Rfl: 0   oxyCODONE (OXYCONTIN) 10 MG 12 hr tablet, Take 10 mg by mouth every 4 (four) hours as needed., Disp: ,  Rfl:    pravastatin (PRAVACHOL) 20 MG tablet, Take 10 mg by mouth daily. , Disp: , Rfl:    predniSONE (STERAPRED UNI-PAK 21 TAB) 10 MG (21) TBPK tablet, Take by mouth daily. Take 6 tabs by mouth daily  for 2 days, then 5 tabs for 2 days, then 4 tabs for 2 days, then 3 tabs for 2 days, 2 tabs for 2 days, then 1 tab by mouth daily for 2 days, Disp: 42 tablet, Rfl: 0

## 2022-04-03 ENCOUNTER — Institutional Professional Consult (permissible substitution): Payer: Medicare Other | Admitting: Student

## 2022-09-24 ENCOUNTER — Encounter (HOSPITAL_BASED_OUTPATIENT_CLINIC_OR_DEPARTMENT_OTHER): Payer: Self-pay | Admitting: Emergency Medicine

## 2022-09-24 ENCOUNTER — Other Ambulatory Visit: Payer: Self-pay

## 2022-09-24 ENCOUNTER — Emergency Department (HOSPITAL_BASED_OUTPATIENT_CLINIC_OR_DEPARTMENT_OTHER)
Admission: EM | Admit: 2022-09-24 | Discharge: 2022-09-24 | Disposition: A | Discharge status: Home / Self Care | Payer: 59 | Attending: Emergency Medicine | Admitting: Emergency Medicine

## 2022-09-24 ENCOUNTER — Emergency Department (HOSPITAL_BASED_OUTPATIENT_CLINIC_OR_DEPARTMENT_OTHER): Payer: 59

## 2022-09-24 DIAGNOSIS — M5416 Radiculopathy, lumbar region: Secondary | ICD-10-CM

## 2022-09-24 DIAGNOSIS — Z7902 Long term (current) use of antithrombotics/antiplatelets: Secondary | ICD-10-CM | POA: Insufficient documentation

## 2022-09-24 DIAGNOSIS — M48061 Spinal stenosis, lumbar region without neurogenic claudication: Secondary | ICD-10-CM

## 2022-09-24 DIAGNOSIS — M79604 Pain in right leg: Secondary | ICD-10-CM | POA: Insufficient documentation

## 2022-09-24 LAB — CBC WITH DIFFERENTIAL/PLATELET
Abs Immature Granulocytes: 0.04 10*3/uL (ref 0.00–0.07)
Basophils Absolute: 0.1 10*3/uL (ref 0.0–0.1)
Basophils Relative: 1 %
Eosinophils Absolute: 0.2 10*3/uL (ref 0.0–0.5)
Eosinophils Relative: 2 %
HCT: 40.8 % (ref 36.0–46.0)
Hemoglobin: 12.6 g/dL (ref 12.0–15.0)
Immature Granulocytes: 0 %
Lymphocytes Relative: 18 %
Lymphs Abs: 1.6 10*3/uL (ref 0.7–4.0)
MCH: 24.8 pg — ABNORMAL LOW (ref 26.0–34.0)
MCHC: 30.9 g/dL (ref 30.0–36.0)
MCV: 80.3 fL (ref 80.0–100.0)
Monocytes Absolute: 0.7 10*3/uL (ref 0.1–1.0)
Monocytes Relative: 8 %
Neutro Abs: 6.3 10*3/uL (ref 1.7–7.7)
Neutrophils Relative %: 71 %
Platelets: 243 10*3/uL (ref 150–400)
RBC: 5.08 MIL/uL (ref 3.87–5.11)
RDW: 18.6 % — ABNORMAL HIGH (ref 11.5–15.5)
WBC: 8.9 10*3/uL (ref 4.0–10.5)
nRBC: 0 % (ref 0.0–0.2)

## 2022-09-24 LAB — COMPREHENSIVE METABOLIC PANEL
ALT: 23 U/L (ref 0–44)
AST: 34 U/L (ref 15–41)
Albumin: 3.6 g/dL (ref 3.5–5.0)
Alkaline Phosphatase: 83 U/L (ref 38–126)
Anion gap: 9 (ref 5–15)
BUN: 24 mg/dL — ABNORMAL HIGH (ref 8–23)
CO2: 25 mmol/L (ref 22–32)
Calcium: 9.1 mg/dL (ref 8.9–10.3)
Chloride: 105 mmol/L (ref 98–111)
Creatinine, Ser: 1.1 mg/dL — ABNORMAL HIGH (ref 0.44–1.00)
GFR, Estimated: 53 mL/min — ABNORMAL LOW (ref >60)
Glucose, Bld: 84 mg/dL (ref 70–99)
Potassium: 3.8 mmol/L (ref 3.5–5.1)
Sodium: 139 mmol/L (ref 135–145)
Total Bilirubin: 0.4 mg/dL (ref 0.3–1.2)
Total Protein: 7.4 g/dL (ref 6.5–8.1)

## 2022-09-24 MED ORDER — ONDANSETRON HCL 4 MG/2ML IJ SOLN
4.0000 mg | Freq: Once | INTRAMUSCULAR | Status: AC
  Administered 2022-09-24: 4 mg via INTRAVENOUS
  Filled 2022-09-24: qty 2

## 2022-09-24 MED ORDER — METHYLPREDNISOLONE SODIUM SUCC 125 MG IJ SOLR
125.0000 mg | Freq: Once | INTRAMUSCULAR | Status: AC
  Administered 2022-09-24: 125 mg via INTRAVENOUS
  Filled 2022-09-24: qty 2

## 2022-09-24 MED ORDER — HYDROMORPHONE HCL 1 MG/ML IJ SOLN
1.0000 mg | Freq: Once | INTRAMUSCULAR | Status: AC
  Administered 2022-09-24: 1 mg via INTRAVENOUS
  Filled 2022-09-24: qty 1

## 2022-09-24 MED ORDER — CYCLOBENZAPRINE HCL 5 MG PO TABS: 5.0000 mg | ORAL_TABLET | Freq: Three times a day (TID) | ORAL | 0 refills | Status: DC | PRN

## 2022-09-24 MED ORDER — OXYCODONE-ACETAMINOPHEN 5-325 MG PO TABS: 1.0000 | ORAL_TABLET | Freq: Four times a day (QID) | ORAL | 0 refills | Status: DC | PRN

## 2022-09-24 MED ORDER — ONDANSETRON 4 MG PO TBDP: ORAL_TABLET | ORAL | 0 refills | Status: AC

## 2022-09-24 MED ORDER — METHYLPREDNISOLONE 4 MG PO TBPK: ORAL_TABLET | ORAL | 0 refills | Status: AC

## 2022-09-24 NOTE — ED Provider Notes (Signed)
[facility] DEPART[facility] Provider Note   CSN: [id number] Arrival date & time: 09/24/22  1834     History  Chief Complaint  Patient presents with   Leg Pain    Holly Perkins is a 77 y.o. female history of sciatica here presenting with right leg pain and back pain.  She states that she has a history of sciatica and for the last 2 days she has sharp shooting pain down the right leg.  She states that it feels like pins-and-needles.  Denies any trauma or injury or falls.  Denies any incontinence  The history is provided by the patient.       Home Medications Prior to Admission medications   Medication Sig Start Date End Date Taking? Authorizing Provider  allopurinol (ZYLOPRIM) 100 MG tablet Take 100 mg by mouth daily.    [provider]  B Complex Vitamins (B-COMPLEX/B-12 PO) Take by mouth.      [provider]  benzonatate (TESSALON) 100 MG capsule Take 1 capsule (100 mg total) by mouth every 8 (eight) hours. 09/17/14   [name], MD  clopidogrel (PLAVIX) 75 MG tablet Take 75 mg by mouth daily.      [provider]  ergocalciferol (VITAMIN D2) 50000 UNITS capsule Take 50,000 Units by mouth once a week.    [provider]  furosemide (LASIX) 40 MG tablet Take 1 tablet (40 mg total) by mouth daily. 01/02/20   Horton, Barbette Hair, MD  hydrochlorothiazide (HYDRODIURIL) 25 MG tablet Take 25 mg by mouth daily.      [provider]  HYDROcodone-acetaminophen (NORCO/VICODIN) 5-325 MG tablet Take 1-2 tablets by mouth every 6 (six) hours as needed. 06/26/19   Volanda Napoleon, PA-C  levofloxacin (LEVAQUIN) 500 MG tablet Take 500 mg by mouth daily.    [provider]  lidocaine (LIDODERM) 5 % Place 1 patch onto the skin daily. Remove & Discard patch within 12 hours or as directed by MD 06/26/19   Volanda Napoleon, PA-C  lisinopril (PRINIVIL,ZESTRIL) 40 MG tablet Take 40 mg by mouth daily.      [provider]   metFORMIN (GLUCOPHAGE) 500 MG tablet Take by mouth 2 (two) times daily with a meal.    [provider]  methocarbamol (ROBAXIN) 500 MG tablet Take 1 tablet (500 mg total) by mouth every 8 (eight) hours as needed for muscle spasms. 03/29/22   Johny Blamer, DO  metroNIDAZOLE (FLAGYL) 500 MG tablet Take 500 mg by mouth 3 (three) times daily.    [provider]  omeprazole (PRILOSEC) 20 MG capsule Take 1 capsule (20 mg total) by mouth 2 (two) times daily. 03/04/17   Tanna Furry, MD  ondansetron (ZOFRAN ODT) 4 MG disintegrating tablet Take 1 tablet (4 mg total) by mouth every 8 (eight) hours as needed for nausea. 09/17/14   [name], MD  oxyCODONE (OXYCONTIN) 10 MG 12 hr tablet Take 10 mg by mouth every 4 (four) hours as needed.    [provider]  pravastatin (PRAVACHOL) 20 MG tablet Take 10 mg by mouth daily.     [provider]  predniSONE (STERAPRED UNI-PAK 21 TAB) 10 MG (21) TBPK tablet Take by mouth daily. Take 6 tabs by mouth daily  for 2 days, then 5 tabs for 2 days, then 4 tabs for 2 days, then 3 tabs for 2 days, 2 tabs for 2 days, then 1 tab by mouth daily for 2 days 08/30/18   Eliezer Mccoy  M, PA-C      Allergies    Hydrocodone-acetaminophen    Review of Systems   Review of Systems  Musculoskeletal:  Positive for back pain.       Right leg pain  All other systems reviewed and are negative.   Physical Exam Updated Vital Signs BP (!) 147/85 (BP Location: Right Arm)   Pulse (!) 49   Temp (!) 97.4 F (36.3 C) (Oral)   Resp 20   Ht '5\' 2"'$  (1.575 m)   Wt 129.7 kg   SpO2 98%   BMI 52.31 kg/m  Physical Exam Vitals and nursing note reviewed.  Constitutional:      Comments: Uncomfortable  HENT:     Head: Normocephalic.     Nose: Nose normal.     Mouth/Throat:     Mouth: Mucous membranes are moist.  Eyes:     Extraocular Movements: Extraocular movements intact.     Pupils: Pupils are equal, round, and reactive to light.   Cardiovascular:     Rate and Rhythm: Normal rate and regular rhythm.     Pulses: Normal pulses.     Heart sounds: Normal heart sounds.  Pulmonary:     Effort: Pulmonary effort is normal.     Breath sounds: Normal breath sounds.  Abdominal:     General: Abdomen is flat.     Palpations: Abdomen is soft.  Musculoskeletal:     Cervical back: Normal range of motion and neck supple.     Comments: Right SI joint versus paralumbar tenderness.  Patient has positive straight leg of the right leg.  Patient has normal knee reflexes bilaterally.  No saddle anesthesia  Skin:    Capillary Refill: Capillary refill takes less than 2 seconds.  Neurological:     General: No focal deficit present.     Mental Status: She is alert and oriented to person, place, and time.     Comments: No saddle anesthesia patient has decreased strength on the right leg due to pain.  Normal reflexes bilateral knees  Psychiatric:        Mood and Affect: Mood normal.        Behavior: Behavior normal.     ED Results / Procedures / Treatments   Labs (all labs ordered are listed, but only abnormal results are displayed) Labs Reviewed  CBC WITH DIFFERENTIAL/PLATELET - Abnormal; Notable for the following components:      Result Value   MCH 24.8 (*)    RDW 18.6 (*)    All other components within normal limits  COMPREHENSIVE METABOLIC PANEL - Abnormal; Notable for the following components:   BUN 24 (*)    Creatinine, Ser 1.10 (*)    GFR, Estimated 53 (*)    All other components within normal limits    EKG None  Radiology No results found.  Procedures Procedures    Medications Ordered in ED Medications  HYDROmorphone (DILAUDID) injection 1 mg (1 mg Intravenous Given 09/24/22 1959)  ondansetron (ZOFRAN) injection 4 mg (4 mg Intravenous Given 09/24/22 1958)  methylPREDNISolone sodium succinate (SOLU-MEDROL) 125 mg/2 mL injection 125 mg (125 mg Intravenous Given 09/24/22 2000)    ED Course/ Medical Decision  Making/ A&P                             Medical Decision Making Holly Perkins is a 77 y.o. female here presenting with back pain radiated down the right leg.  I  think likely sciatica.  Will get CT lumbar spine to rule out compression fractures.  Patient has no saddle anesthesia to suggest cauda equina.  Plan to get CBC and CMP and also give pain medicine and muscle relaxant.  10:04 PM Reviewed patient labs and they were unremarkable.  CT showed moderate spinal stenosis L3-4 and L5-S1.  Patient is feeling much better after pain medicine and steroids.  Will discharge home with a course of steroids and also pain medicine the muscle relaxants and NSAIDs.  Will refer to neurosurgery outpatient.  Problems Addressed: Spinal stenosis at L4-L5 level: acute illness or injury  Amount and/or Complexity of Data Reviewed Labs: ordered. Decision-making details documented in ED Course. Radiology: ordered and independent interpretation performed. Decision-making details documented in ED Course.  Risk Prescription drug management.   Final Clinical Impression(s) / ED Diagnoses Final diagnoses:  None    Rx / DC Orders ED Discharge Orders     None         Drenda Freeze, MD 09/24/22 2205

## 2022-09-24 NOTE — ED Triage Notes (Signed)
Pt c/o RLE pain from buttocks radiating down leg; started Mon, no injury

## 2022-09-24 NOTE — Discharge Instructions (Addendum)
You have spinal stenosis causing radiculopathy  Please take Motrin for pain and Flexeril for muscle spasm.  I have prescribed Percocet for severe pain and also steroids.  You can take some Zofran if you nauseated after taking these medicines  You have spinal stenosis and you need to follow-up with the neurosurgeon  Return to ER if you have worse back pain or leg pain or trouble walking or trouble urinating

## 2023-01-15 ENCOUNTER — Emergency Department (HOSPITAL_COMMUNITY): Payer: 59

## 2023-01-15 ENCOUNTER — Other Ambulatory Visit: Payer: Self-pay

## 2023-01-15 ENCOUNTER — Emergency Department (HOSPITAL_COMMUNITY)
Admission: EM | Admit: 2023-01-15 | Discharge: 2023-01-16 | Disposition: A | Discharge status: Home / Self Care | Payer: 59 | Attending: Emergency Medicine | Admitting: Emergency Medicine

## 2023-01-15 DIAGNOSIS — Z7902 Long term (current) use of antithrombotics/antiplatelets: Secondary | ICD-10-CM | POA: Diagnosis not present

## 2023-01-15 DIAGNOSIS — R600 Localized edema: Secondary | ICD-10-CM | POA: Diagnosis not present

## 2023-01-15 DIAGNOSIS — W010XXA Fall on same level from slipping, tripping and stumbling without subsequent striking against object, initial encounter: Secondary | ICD-10-CM | POA: Diagnosis not present

## 2023-01-15 DIAGNOSIS — R262 Difficulty in walking, not elsewhere classified: Secondary | ICD-10-CM

## 2023-01-15 DIAGNOSIS — M5441 Lumbago with sciatica, right side: Secondary | ICD-10-CM | POA: Diagnosis not present

## 2023-01-15 DIAGNOSIS — W19XXXA Unspecified fall, initial encounter: Secondary | ICD-10-CM

## 2023-01-15 DIAGNOSIS — R519 Headache, unspecified: Secondary | ICD-10-CM | POA: Insufficient documentation

## 2023-01-15 DIAGNOSIS — M5431 Sciatica, right side: Secondary | ICD-10-CM

## 2023-01-15 DIAGNOSIS — M545 Low back pain, unspecified: Secondary | ICD-10-CM | POA: Diagnosis present

## 2023-01-15 DIAGNOSIS — Y92002 Bathroom of unspecified non-institutional (private) residence single-family (private) house as the place of occurrence of the external cause: Secondary | ICD-10-CM | POA: Insufficient documentation

## 2023-01-15 DIAGNOSIS — R2689 Other abnormalities of gait and mobility: Secondary | ICD-10-CM | POA: Insufficient documentation

## 2023-01-15 LAB — COMPREHENSIVE METABOLIC PANEL
ALT: 21 U/L (ref 0–44)
AST: 33 U/L (ref 15–41)
Albumin: 3.6 g/dL (ref 3.5–5.0)
Alkaline Phosphatase: 70 U/L (ref 38–126)
Anion gap: 11 (ref 5–15)
BUN: 15 mg/dL (ref 8–23)
CO2: 21 mmol/L — ABNORMAL LOW (ref 22–32)
Calcium: 9.4 mg/dL (ref 8.9–10.3)
Chloride: 106 mmol/L (ref 98–111)
Creatinine, Ser: 1.16 mg/dL — ABNORMAL HIGH (ref 0.44–1.00)
GFR, Estimated: 49 mL/min — ABNORMAL LOW (ref >60)
Glucose, Bld: 101 mg/dL — ABNORMAL HIGH (ref 70–99)
Potassium: 3.5 mmol/L (ref 3.5–5.1)
Sodium: 138 mmol/L (ref 135–145)
Total Bilirubin: 1.1 mg/dL (ref 0.3–1.2)
Total Protein: 6.8 g/dL (ref 6.5–8.1)

## 2023-01-15 LAB — CBC
HCT: 43 % (ref 36.0–46.0)
Hemoglobin: 13.7 g/dL (ref 12.0–15.0)
MCH: 26.3 pg (ref 26.0–34.0)
MCHC: 31.9 g/dL (ref 30.0–36.0)
MCV: 82.7 fL (ref 80.0–100.0)
Platelets: 221 10*3/uL (ref 150–400)
RBC: 5.2 MIL/uL — ABNORMAL HIGH (ref 3.87–5.11)
RDW: 16.7 % — ABNORMAL HIGH (ref 11.5–15.5)
WBC: 10.3 10*3/uL (ref 4.0–10.5)
nRBC: 0 % (ref 0.0–0.2)

## 2023-01-15 LAB — URINALYSIS, ROUTINE W REFLEX MICROSCOPIC
Bilirubin Urine: NEGATIVE
Glucose, UA: NEGATIVE mg/dL
Hgb urine dipstick: NEGATIVE
Ketones, ur: 5 mg/dL — AB
Leukocytes,Ua: NEGATIVE
Nitrite: NEGATIVE
Protein, ur: NEGATIVE mg/dL
Specific Gravity, Urine: 1.012 (ref 1.005–1.030)
pH: 5 (ref 5.0–8.0)

## 2023-01-15 LAB — CK: Total CK: 198 U/L (ref 38–234)

## 2023-01-15 LAB — LACTIC ACID, PLASMA: Lactic Acid, Venous: 1.6 mmol/L (ref 0.5–1.9)

## 2023-01-15 MED ORDER — PRAVASTATIN SODIUM 10 MG PO TABS
10.0000 mg | ORAL_TABLET | Freq: Every day | ORAL | Status: DC
  Administered 2023-01-15: 10 mg via ORAL
  Filled 2023-01-15: qty 1

## 2023-01-15 MED ORDER — ALLOPURINOL 100 MG PO TABS
100.0000 mg | ORAL_TABLET | Freq: Every day | ORAL | Status: DC
  Administered 2023-01-15 – 2023-01-16 (×2): 100 mg via ORAL
  Filled 2023-01-15: qty 1

## 2023-01-15 MED ORDER — DEXAMETHASONE SODIUM PHOSPHATE 10 MG/ML IJ SOLN
10.0000 mg | Freq: Once | INTRAMUSCULAR | Status: AC
  Administered 2023-01-15: 10 mg via INTRAVENOUS
  Filled 2023-01-15: qty 1

## 2023-01-15 MED ORDER — CYCLOBENZAPRINE HCL 10 MG PO TABS
5.0000 mg | ORAL_TABLET | Freq: Three times a day (TID) | ORAL | Status: DC | PRN
  Administered 2023-01-15: 5 mg via ORAL
  Filled 2023-01-15 (×2): qty 1

## 2023-01-15 MED ORDER — OXYCODONE HCL 5 MG PO TABS
10.0000 mg | ORAL_TABLET | ORAL | Status: DC | PRN
  Administered 2023-01-15 – 2023-01-16 (×3): 10 mg via ORAL
  Filled 2023-01-15 (×3): qty 2

## 2023-01-15 MED ORDER — PANTOPRAZOLE SODIUM 40 MG PO TBEC
40.0000 mg | DELAYED_RELEASE_TABLET | Freq: Every day | ORAL | Status: DC
  Administered 2023-01-15 – 2023-01-16 (×2): 40 mg via ORAL
  Filled 2023-01-15 (×2): qty 1

## 2023-01-15 MED ORDER — OXYCODONE-ACETAMINOPHEN 5-325 MG PO TABS
1.0000 | ORAL_TABLET | Freq: Once | ORAL | Status: AC
  Administered 2023-01-15: 1 via ORAL
  Filled 2023-01-15: qty 1

## 2023-01-15 MED ORDER — HYDROMORPHONE HCL 1 MG/ML IJ SOLN
1.0000 mg | Freq: Once | INTRAMUSCULAR | Status: AC
  Administered 2023-01-15: 1 mg via INTRAVENOUS
  Filled 2023-01-15: qty 1

## 2023-01-15 MED ORDER — METHOCARBAMOL 500 MG PO TABS
500.0000 mg | ORAL_TABLET | Freq: Three times a day (TID) | ORAL | Status: DC | PRN
  Administered 2023-01-15 – 2023-01-16 (×2): 500 mg via ORAL
  Filled 2023-01-15 (×2): qty 1

## 2023-01-15 MED ORDER — ONDANSETRON 4 MG PO TBDP: 4.0000 mg | ORAL_TABLET | Freq: Three times a day (TID) | ORAL | Status: DC | PRN

## 2023-01-15 MED ORDER — OXYCODONE HCL 10 MG PO TB12: 10.0000 mg | ORAL_TABLET | ORAL | Status: DC | PRN

## 2023-01-15 MED ORDER — LISINOPRIL 20 MG PO TABS
40.0000 mg | ORAL_TABLET | Freq: Every day | ORAL | Status: DC
  Administered 2023-01-15 – 2023-01-16 (×2): 40 mg via ORAL
  Filled 2023-01-15 (×2): qty 2

## 2023-01-15 MED ORDER — ONDANSETRON HCL 4 MG/2ML IJ SOLN
4.0000 mg | Freq: Once | INTRAMUSCULAR | Status: AC
  Administered 2023-01-15: 4 mg via INTRAVENOUS
  Filled 2023-01-15: qty 2

## 2023-01-15 MED ORDER — HYDROCHLOROTHIAZIDE 25 MG PO TABS
25.0000 mg | ORAL_TABLET | Freq: Every day | ORAL | Status: DC
  Administered 2023-01-15 – 2023-01-16 (×2): 25 mg via ORAL
  Filled 2023-01-15 (×2): qty 1

## 2023-01-15 MED ORDER — METFORMIN HCL 500 MG PO TABS
500.0000 mg | ORAL_TABLET | Freq: Two times a day (BID) | ORAL | Status: DC
  Administered 2023-01-15 – 2023-01-16 (×3): 500 mg via ORAL
  Filled 2023-01-15 (×3): qty 1

## 2023-01-15 MED ORDER — CLOPIDOGREL BISULFATE 75 MG PO TABS
75.0000 mg | ORAL_TABLET | Freq: Every day | ORAL | Status: DC
  Administered 2023-01-15 – 2023-01-16 (×2): 75 mg via ORAL
  Filled 2023-01-15 (×2): qty 1

## 2023-01-15 MED ORDER — PRAVASTATIN SODIUM 10 MG PO TABS: 10.0000 mg | ORAL_TABLET | Freq: Every day | ORAL | Status: DC

## 2023-01-15 MED ORDER — FUROSEMIDE 20 MG PO TABS
40.0000 mg | ORAL_TABLET | Freq: Every day | ORAL | Status: DC
  Administered 2023-01-15 – 2023-01-16 (×2): 40 mg via ORAL
  Filled 2023-01-15 (×2): qty 2

## 2023-01-15 MED ORDER — HYDROCODONE-ACETAMINOPHEN 5-325 MG PO TABS
1.0000 | ORAL_TABLET | Freq: Four times a day (QID) | ORAL | Status: DC | PRN
  Administered 2023-01-16 (×2): 2 via ORAL
  Filled 2023-01-15 (×2): qty 2

## 2023-01-15 NOTE — ED Notes (Signed)
VS completed at 1315

## 2023-01-15 NOTE — Progress Notes (Signed)
TOC CSW obtained pts PASSR #.  [id number] A  [name], MSW, LCSW-A Pronouns:  She/Her/Hers [facility] HealthTransitions of Care Clinical Social Worker Direct Number:  [phone] [email or web address]

## 2023-01-15 NOTE — Progress Notes (Signed)
Orthopedic Tech Progress Note Patient Details:  Holly Perkins [birth year] [id number]  Level 2 trauma, ortho techs present upon pt arrival  Patient ID: Armahni Vincenti, female   DOB: 1945/11/14, 77 y.o.   MRN: [id number]  Docia Furl 01/15/2023, 3:56 PM

## 2023-01-15 NOTE — ED Provider Notes (Signed)
[place] [facility] AT [facility] Provider Note   CSN: [id number] Arrival date & time: 01/15/23  1326     History  Chief Complaint  Patient presents with   Fall   level 2 FOT    Holly Perkins is a 77 y.o. female.  HPI Patient lost her balance and fell in her bathroom.  She was unable to get back up.  Patient reports she has been having problems with ongoing severe sciatica.  She reports she has had pain in the left lower back right around the SI joint for quite a while but for the past 2 weeks has been severe and radiates around her groin some.  Reports when she fell she did hit her head and takes Plavix.  She denies loss of consciousness.  She reports she has had a persistent moderate headache.  She denies any neck pain.  He denies focal weakness numbness or tingling of the extremities.  Ports that the pain she has in her lower back has been preexistent and does not seem to be due to the fall.  However, due to her pain and body habitus she was not able to get back up and spent 13 hours on the floor until her daughter found her this morning.    Home Medications Prior to Admission medications   Medication Sig Start Date End Date Taking? Authorizing Provider  allopurinol (ZYLOPRIM) 100 MG tablet Take 100 mg by mouth daily.    [provider]  B Complex Vitamins (B-COMPLEX/B-12 PO) Take by mouth.      [provider]  benzonatate (TESSALON) 100 MG capsule Take 1 capsule (100 mg total) by mouth every 8 (eight) hours. 09/17/14   Eber Hong, MD  clopidogrel (PLAVIX) 75 MG tablet Take 75 mg by mouth daily.      [provider]  cyclobenzaprine (FLEXERIL) 5 MG tablet Take 1 tablet (5 mg total) by mouth 3 (three) times daily as needed. 09/24/22   Charlynne Pander, MD  ergocalciferol (VITAMIN D2) 50000 UNITS capsule Take 50,000 Units by mouth once a week.    [provider]  furosemide (LASIX) 40 MG tablet Take 1 tablet (40 mg total)  by mouth daily. 01/02/20   Horton, Mayer Masker, MD  hydrochlorothiazide (HYDRODIURIL) 25 MG tablet Take 25 mg by mouth daily.      [provider]  HYDROcodone-acetaminophen (NORCO/VICODIN) 5-325 MG tablet Take 1-2 tablets by mouth every 6 (six) hours as needed. 06/26/19   Maxwell Caul, PA-C  levofloxacin (LEVAQUIN) 500 MG tablet Take 500 mg by mouth daily.    [provider]  lidocaine (LIDODERM) 5 % Place 1 patch onto the skin daily. Remove & Discard patch within 12 hours or as directed by MD 06/26/19   Maxwell Caul, PA-C  lisinopril (PRINIVIL,ZESTRIL) 40 MG tablet Take 40 mg by mouth daily.      [provider]  metFORMIN (GLUCOPHAGE) 500 MG tablet Take by mouth 2 (two) times daily with a meal.    [provider]  methocarbamol (ROBAXIN) 500 MG tablet Take 1 tablet (500 mg total) by mouth every 8 (eight) hours as needed for muscle spasms. 03/29/22   Rocky Morel, DO  methylPREDNISolone (MEDROL DOSEPAK) 4 MG TBPK tablet Use as directed 09/24/22   Charlynne Pander, MD  metroNIDAZOLE (FLAGYL) 500 MG tablet Take 500 mg by mouth 3 (three) times daily.    [provider]  omeprazole (PRILOSEC) 20 MG capsule Take 1  capsule (20 mg total) by mouth 2 (two) times daily. 03/04/17   Rolland Porter, MD  ondansetron (ZOFRAN ODT) 4 MG disintegrating tablet Take 1 tablet (4 mg total) by mouth every 8 (eight) hours as needed for nausea. 09/17/14   Eber Hong, MD  ondansetron (ZOFRAN-ODT) 4 MG disintegrating tablet 4mg  ODT q4 hours prn nausea/vomit 09/24/22   Charlynne Pander, MD  oxyCODONE (OXYCONTIN) 10 MG 12 hr tablet Take 10 mg by mouth every 4 (four) hours as needed.    [provider]  oxyCODONE-acetaminophen (PERCOCET) 5-325 MG tablet Take 1 tablet by mouth every 6 (six) hours as needed. 09/24/22   Charlynne Pander, MD  pravastatin (PRAVACHOL) 20 MG tablet Take 10 mg by mouth daily.     [provider]  predniSONE (STERAPRED UNI-PAK 21  TAB) 10 MG (21) TBPK tablet Take by mouth daily. Take 6 tabs by mouth daily  for 2 days, then 5 tabs for 2 days, then 4 tabs for 2 days, then 3 tabs for 2 days, 2 tabs for 2 days, then 1 tab by mouth daily for 2 days 08/30/18   Emi Holes, PA-C      Allergies    Hydrocodone-acetaminophen    Review of Systems   Review of Systems  Physical Exam Updated Vital Signs BP (!) 172/85   Pulse (!) 57   Temp 97.7 F (36.5 C) (Oral)   Resp 10   Ht 5\' 2"  (1.575 m)   Wt 124.7 kg   SpO2 100%   BMI 50.30 kg/m  Physical Exam Constitutional:      Comments: Patient is alert.  GCS 15.  No respiratory distress.  Mental status is clear.  She is uncomfortable lying on her back.  HENT:     Head: Normocephalic and atraumatic.     Nose: Nose normal.  Eyes:     Extraocular Movements: Extraocular movements intact.     Pupils: Pupils are equal, round, and reactive to light.  Neck:     Comments: No midline C-spine tenderness. Cardiovascular:     Rate and Rhythm: Normal rate and regular rhythm.  Pulmonary:     Effort: Pulmonary effort is normal.     Breath sounds: Normal breath sounds.  Abdominal:     Comments: Abdomen is obese, somewhat limiting exam but patient does not endorse tenderness to palpation.  No objective bruising over the abdominal wall.  Musculoskeletal:     Comments: Patient does not appear to have any extremity deformities.  She can use both upper extremities help pull her body weight on the stretcher.  No motor deficits of the upper extremities.  Lower extremities, patient is able to move both of them.  She does have edema both feet that appears to be chronic and less than typical.  The skin has crenulated appearance but she still has about 2+ pitting edema.  No visual bruising to the back.  Patient does not endorse midline pain to palpation.  Area of pain is over the upper aspect of the SI joint on the left.  Skin:    General: Skin is warm and dry.  Neurological:     General: No  focal deficit present.     Mental Status: She is oriented to person, place, and time.     Coordination: Coordination normal.     Comments: No focal neurologic deficits.  Can move all extremities to command.  She does have morbid obesity which limits mobility but does not seem to  have any focal deficits.     ED Results / Procedures / Treatments   Labs (all labs ordered are listed, but only abnormal results are displayed) Labs Reviewed  COMPREHENSIVE METABOLIC PANEL - Abnormal; Notable for the following components:      Result Value   CO2 21 (*)    Glucose, Bld 101 (*)    Creatinine, Ser 1.16 (*)    GFR, Estimated 49 (*)    All other components within normal limits  CBC - Abnormal; Notable for the following components:   RBC 5.20 (*)    RDW 16.7 (*)    All other components within normal limits  LACTIC ACID, PLASMA  CK  URINALYSIS, ROUTINE W REFLEX MICROSCOPIC    EKG EKG Interpretation  Date/Time:  Thursday Jan 15 2023 13:54:30 EDT Ventricular Rate:  39 PR Interval:  158 QRS Duration: 114 QT Interval:  482 QTC Calculation: 389 R Axis:   17 Text Interpretation: Sinus bradycardia Borderline intraventricular conduction delay Abnormal R-wave progression, early transition no acute ischemic appearance, no sig change fom previous except slower rate. Confirmed by Arby Barrette [phone]) on 01/15/2023 4:59:26 PM  Radiology CT HEAD WO CONTRAST  Result Date: 01/15/2023 CLINICAL DATA:  Provided history: Head trauma, moderate/severe. Fall. EXAM: CT HEAD WITHOUT CONTRAST TECHNIQUE: Contiguous axial images were obtained from the base of the skull through the vertex without intravenous contrast. RADIATION DOSE REDUCTION: This exam was performed according to the departmental dose-optimization program which includes automated exposure control, adjustment of the mA and/or kV according to patient size and/or use of iterative reconstruction technique. COMPARISON:  No pertinent prior exams available  for comparison. FINDINGS: Three/beam hardening artifact somewhat limits evaluation of the posterior fossa. Within this limitation, findings are swallows. Brain: Patchy and ill-defined hypoattenuation within the cerebral white matter, nonspecific but compatible with mild chronic small vessel ischemic disease. There is no acute intracranial hemorrhage. No demarcated cortical infarct. No extra-axial fluid collection. No evidence of an intracranial mass. No midline shift. Vascular: No hyperdense vessel. Atherosclerotic calcifications. Skull: No fracture or aggressive osseous lesion. Sinuses/Orbits: Bilateral proptosis. Minimal mucosal thickening within the bilateral frontal and ethmoid sinuses. Mild-to-moderate mucosal thickening within the left maxillary sinus. IMPRESSION: 1. Streak/beam hardening artifact somewhat limits evaluation of the posterior fossa. 2. Within this limitation, there is no evidence of an acute intracranial abnormality. 3. Mild chronic small vessel ischemic changes within the cerebral white matter. 4. Bilateral proptosis. 5. Paranasal sinus disease as described. Electronically Signed   By: Jackey Loge D.O.   On: 01/15/2023 14:47   DG Pelvis Portable  Result Date: 01/15/2023 CLINICAL DATA:  Fall. EXAM: PORTABLE PELVIS 1-2 VIEWS COMPARISON:  None Available. FINDINGS: There is no evidence of pelvic fracture or diastasis. No pelvic bone lesions are seen. IMPRESSION: Negative. Electronically Signed   By: Lupita Raider M.D.   On: 01/15/2023 14:11   DG Chest Port 1 View  Result Date: 01/15/2023 CLINICAL DATA:  Fall. EXAM: PORTABLE CHEST 1 VIEW COMPARISON:  April 03, 2022. FINDINGS: Stable cardiomegaly. Both lungs are clear. The visualized skeletal structures are unremarkable. IMPRESSION: No active disease. Electronically Signed   By: Lupita Raider M.D.   On: 01/15/2023 14:10    Procedures Procedures    Medications Ordered in ED Medications  dexamethasone (DECADRON) injection 10 mg (has  no administration in time range)  oxyCODONE-acetaminophen (PERCOCET/ROXICET) 5-325 MG per tablet 1 tablet (has no administration in time range)  clopidogrel (PLAVIX) tablet 75 mg (has no administration in time  range)  metFORMIN (GLUCOPHAGE) tablet 500 mg (has no administration in time range)  HYDROmorphone (DILAUDID) injection 1 mg (1 mg Intravenous Given 01/15/23 1346)  ondansetron (ZOFRAN) injection 4 mg (4 mg Intravenous Given 01/15/23 1346)    ED Course/ Medical Decision Making/ A&P                             Medical Decision Making Amount and/or Complexity of Data Reviewed Labs: ordered. Radiology: ordered.  Risk Prescription drug management.   Patient has pre-existing lumbar back pain.  Patient has been trying to manage pain at home with Vicodin although not getting good pain control.  She fell due to balance and mobility issues and was unable to get up for 13 hours.  She does take Plavix.  At this time we will proceed with diagnostic evaluation including CT head and basic lab work.  At this time no apparent neurologic deficits.  Patient exam is limited by body habitus and more chronic lumbar pain.  Will proceed with diagnostic evaluation and pain control and reassess.  CT scan head no acute findings.  Pelvis x-ray portable chest no acute findings. Basic metabolic panel stable.  GFR 49.  CBC with no anemia.    Has had increasing mobility problems at home with added feature of sciatic pain.  Patient had CT scan done in January that showed spinal stenosis and foraminal stenosis which corresponds with patient's pain.  This time she does not appear to have acute injury related to her fall.  However mobility is significant compromise.  Will plan for PT eval for possible placement due to immobility with body habitus and sciatica.       Final Clinical Impression(s) / ED Diagnoses Final diagnoses:  Fall, initial encounter  Sciatica of right side  Ambulatory dysfunction    Rx / DC  Orders ED Discharge Orders     None         Arby Barrette, MD 01/15/23 1711

## 2023-01-15 NOTE — Progress Notes (Signed)
TOC CSW spoke to pt and family at bedside.  Pt plus family has agreed that pt would benefit greatly from a SNF placement.  Pt is currently at home alone and would need a lot of assistance because she is unable to get around.  Holly Perkins, MSW, LCSW-A Pronouns:  She/Her/Hers [facility] Clinical Social Worker Direct Number:  [phone] [email or web address]

## 2023-01-15 NOTE — Progress Notes (Signed)
TOC CSW currently awaiting PT recommendations.    Marylee Belzer Tarpley-Carter, MSW, LCSW-A Pronouns:  She/Her/Hers [facility] Clinical Social Worker Direct Number:  [phone] [email or web address]  

## 2023-01-16 ENCOUNTER — Telehealth: Payer: Self-pay

## 2023-01-16 ENCOUNTER — Encounter (HOSPITAL_COMMUNITY): Payer: Self-pay

## 2023-01-16 ENCOUNTER — Telehealth (HOSPITAL_COMMUNITY): Payer: Self-pay | Admitting: Emergency Medicine

## 2023-01-16 MED ORDER — OXYCODONE-ACETAMINOPHEN 5-325 MG PO TABS: 1.0000 | ORAL_TABLET | Freq: Four times a day (QID) | ORAL | 0 refills | Status: AC | PRN

## 2023-01-16 MED ORDER — METHOCARBAMOL 500 MG PO TABS: 500.0000 mg | ORAL_TABLET | Freq: Three times a day (TID) | ORAL | 0 refills | Status: DC | PRN

## 2023-01-16 NOTE — ED Notes (Signed)
Pt reports that she does not want go  to facility care team contacted

## 2023-01-16 NOTE — NC FL2 (Signed)
[place] MEDICAID FL2 LEVEL OF CARE FORM     IDENTIFICATION  Patient Name: Holly Perkins Birthdate: 02-Jul-1946 Sex: female Admission Date (Current Location): 01/15/2023  [facility] and [place] Number:  Producer, television/film/video and Address:  [facility] [place], [address], [place], [place] [address]      Provider Number: [id number]  Attending Physician Name and Address:  Default, Provider, MD  Relative Name and Phone Number:  Laurence Aly (Daughter) [phone]    Current Level of Care: Hospital Recommended Level of Care: Skilled Nursing Facility Prior Approval Number:    Date Approved/Denied:   PASRR Number: [id number]  Discharge Plan: SNF    Current Diagnoses: There are no problems to display for this patient.   Orientation RESPIRATION BLADDER Height & Weight     Self, Time, Situation, Place  Normal Continent Weight: 275 lb (124.7 kg) Height:  5\' 2"  (157.5 cm)  BEHAVIORAL SYMPTOMS/MOOD NEUROLOGICAL BOWEL NUTRITION STATUS      Continent Diet (Regular)  AMBULATORY STATUS COMMUNICATION OF NEEDS Skin   Extensive Assist Verbally Normal                       Personal Care Assistance Level of Assistance  Bathing, Feeding, Dressing Bathing Assistance: Maximum assistance Feeding assistance: Limited assistance Dressing Assistance: Maximum assistance     Functional Limitations Info  Sight, Hearing, Speech Sight Info: Adequate Hearing Info: Adequate Speech Info: Adequate    SPECIAL CARE FACTORS FREQUENCY  PT (By licensed PT), OT (By licensed OT)     PT Frequency: x5/week OT Frequency: x5/week            Contractures Contractures Info: Not present    Additional Factors Info  Code Status, Allergies, Psychotropic Code Status Info: Not on file Allergies Info: Hydrocodone-acetaminophen Psychotropic Info: None         Current Medications (01/16/2023):  This is the current hospital active medication list Current Facility-Administered  Medications  Medication Dose Route Frequency Provider Last Rate Last Admin   allopurinol (ZYLOPRIM) tablet 100 mg  100 mg Oral Daily Lorre Nick, MD   100 mg at 01/15/23 1813   clopidogrel (PLAVIX) tablet 75 mg  75 mg Oral Daily Arby Barrette, MD   75 mg at 01/15/23 1716   cyclobenzaprine (FLEXERIL) tablet 5 mg  5 mg Oral TID PRN Lorre Nick, MD   5 mg at 01/15/23 2216   furosemide (LASIX) tablet 40 mg  40 mg Oral Daily Lorre Nick, MD   40 mg at 01/15/23 1812   hydrochlorothiazide (HYDRODIURIL) tablet 25 mg  25 mg Oral Daily Lorre Nick, MD   25 mg at 01/15/23 1813   HYDROcodone-acetaminophen (NORCO/VICODIN) 5-325 MG per tablet 1-2 tablet  1-2 tablet Oral Q6H PRN Lorre Nick, MD       lisinopril (ZESTRIL) tablet 40 mg  40 mg Oral Daily Lorre Nick, MD   40 mg at 01/15/23 1812   metFORMIN (GLUCOPHAGE) tablet 500 mg  500 mg Oral BID WC Arby Barrette, MD   500 mg at 01/15/23 1716   methocarbamol (ROBAXIN) tablet 500 mg  500 mg Oral Q8H PRN Lorre Nick, MD   500 mg at 01/16/23 0519   ondansetron (ZOFRAN-ODT) disintegrating tablet 4 mg  4 mg Oral Q8H PRN Lorre Nick, MD       oxyCODONE (Oxy IR/ROXICODONE) immediate release tablet 10 mg  10 mg Oral Q4H PRN Lorre Nick, MD   10 mg at 01/16/23 0520   pantoprazole (  PROTONIX) EC tablet 40 mg  40 mg Oral Daily Lorre Nick, MD   40 mg at 01/15/23 1813   pravastatin (PRAVACHOL) tablet 10 mg  10 mg Oral Daily Lorre Nick, MD   10 mg at 01/15/23 1813   Current Outpatient Medications  Medication Sig Dispense Refill   allopurinol (ZYLOPRIM) 100 MG tablet Take 100 mg by mouth daily.     B Complex Vitamins (B-COMPLEX/B-12 PO) Take by mouth.       benzonatate (TESSALON) 100 MG capsule Take 1 capsule (100 mg total) by mouth every 8 (eight) hours. 21 capsule 0   clopidogrel (PLAVIX) 75 MG tablet Take 75 mg by mouth daily.       cyclobenzaprine (FLEXERIL) 5 MG tablet Take 1 tablet (5 mg total) by mouth 3 (three) times daily as  needed. 10 tablet 0   ergocalciferol (VITAMIN D2) 50000 UNITS capsule Take 50,000 Units by mouth once a week.     furosemide (LASIX) 40 MG tablet Take 1 tablet (40 mg total) by mouth daily. 5 tablet 0   hydrochlorothiazide (HYDRODIURIL) 25 MG tablet Take 25 mg by mouth daily.       HYDROcodone-acetaminophen (NORCO/VICODIN) 5-325 MG tablet Take 1-2 tablets by mouth every 6 (six) hours as needed. 6 tablet 0   levofloxacin (LEVAQUIN) 500 MG tablet Take 500 mg by mouth daily.     lidocaine (LIDODERM) 5 % Place 1 patch onto the skin daily. Remove & Discard patch within 12 hours or as directed by MD 5 patch 0   lisinopril (PRINIVIL,ZESTRIL) 40 MG tablet Take 40 mg by mouth daily.       metFORMIN (GLUCOPHAGE) 500 MG tablet Take by mouth 2 (two) times daily with a meal.     methocarbamol (ROBAXIN) 500 MG tablet Take 1 tablet (500 mg total) by mouth every 8 (eight) hours as needed for muscle spasms. 12 tablet 0   methylPREDNISolone (MEDROL DOSEPAK) 4 MG TBPK tablet Use as directed 21 tablet 0   metroNIDAZOLE (FLAGYL) 500 MG tablet Take 500 mg by mouth 3 (three) times daily.     omeprazole (PRILOSEC) 20 MG capsule Take 1 capsule (20 mg total) by mouth 2 (two) times daily. 60 capsule 1   ondansetron (ZOFRAN ODT) 4 MG disintegrating tablet Take 1 tablet (4 mg total) by mouth every 8 (eight) hours as needed for nausea. 10 tablet 0   ondansetron (ZOFRAN-ODT) 4 MG disintegrating tablet 4mg  ODT q4 hours prn nausea/vomit 10 tablet 0   oxyCODONE (OXYCONTIN) 10 MG 12 hr tablet Take 10 mg by mouth every 4 (four) hours as needed.     oxyCODONE-acetaminophen (PERCOCET) 5-325 MG tablet Take 1 tablet by mouth every 6 (six) hours as needed. 12 tablet 0   pravastatin (PRAVACHOL) 20 MG tablet Take 10 mg by mouth daily.      predniSONE (STERAPRED UNI-PAK 21 TAB) 10 MG (21) TBPK tablet Take by mouth daily. Take 6 tabs by mouth daily  for 2 days, then 5 tabs for 2 days, then 4 tabs for 2 days, then 3 tabs for 2 days, 2 tabs for  2 days, then 1 tab by mouth daily for 2 days 42 tablet 0     Discharge Medications: Please see discharge summary for a list of discharge medications.  Relevant Imaging Results:  Relevant Lab Results:   Additional Information SSN: [id number]  Princella Ion, LCSW

## 2023-01-16 NOTE — Progress Notes (Addendum)
This CSW received a secure chat from RN informing family is at bedside and having second thoughts about SNF. This CSW outreached to pt's daughter, Ines Bloomer, who informed she visited [address] and "did not like the vibe." Shawn reports her mom sleeps a lot and is often discombobulated and she would not want her to go there. Shawn asked the pt "Ma, do you want to go home?" Pt replied in the background "mhm." Shawn states she would be there to assist her mom the majority of the time. This CSW informed she'd attempt to secure HHPT/OT services but did express challenges with [facility] Medicare. Shawn verbalized understanding. Shawn inquired about [facility] but it appears that is for [facility] Aide's to come in and assist with house work, meal preparation, and errands.   -RNCM to speak to family regarding DME recommendations.  -This CSW has outreached to the following HH agencies: [facility]  [facility]  *Daughter is requesting [facility] transport home*

## 2023-01-16 NOTE — Progress Notes (Addendum)
Pt faxed out to [facility]Pt prefers a SNF in [facility].  This CSW informed there are a couple facilities in [place] she could follow up with. Bed offers pending.   [facility] - facility full  Addend @ 10:24 AM Presented bed offers to pt and family. Daughter requests this CSW follow up with [facility]. Awaiting response.

## 2023-01-16 NOTE — Evaluation (Signed)
Physical Therapy Evaluation Patient Details Name: Holly Perkins MRN: [id number] DOB: 08/11/1946 Today's Date: 01/16/2023  History of Present Illness  77 y.o. female presents to [facility] hospital on 01/15/2023 after fall in bathroom. Pt with recent problems with severe sciatica in LLE. PMH includes HTN, CKD, DMII, PAD, GERD, OA.  Clinical Impression  Pt presents to PT with deficits in strength, power, endurance, gait, balance, cognition, and with significant pain in R low back which radiates to RLE. Pt reports numbness in RLE, initially reporting numbness in toes and shin, but later reporting the numbness does not extend to toes or foot. Pt demonstrates significant memory deficits during session, often losing track of conversation and forgetting PT requests or questions moments after being asked. Pt requires significant physical assistance to mobilize on stretcher and to stand. Transitioning to a hospital bed while in the ED would be beneficial as this pt is too short to reach her feet to the floor with the stretcher in the lowest position, exacerbating her already high risk for falls. PT recommends short term inpatient PT services at the time of discharge.       Recommendations for follow up therapy are one component of a multi-disciplinary discharge planning process, led by the attending physician.  Recommendations may be updated based on patient status, additional functional criteria and insurance authorization.  Follow Up Recommendations Can patient physically be transported by private vehicle: No     Assistance Recommended at Discharge Frequent or constant Supervision/Assistance  Patient can return home with the following  A lot of help with walking and/or transfers;A lot of help with bathing/dressing/bathroom;Assistance with cooking/housework;Direct supervision/assist for medications management;Direct supervision/assist for financial management;Assist for transportation;Help with stairs or ramp for  entrance    Equipment Recommendations Wheelchair (measurements PT);Wheelchair cushion (measurements PT);BSC/3in1;Hospital bed  Recommendations for Other Services       Functional Status Assessment Patient has had a recent decline in their functional status and demonstrates the ability to make significant improvements in function in a reasonable and predictable amount of time.     Precautions / Restrictions Precautions Precautions: Fall Restrictions Weight Bearing Restrictions: No      Mobility  Bed Mobility Overal bed mobility: Needs Assistance Bed Mobility: Rolling, Supine to Sit, Sit to Supine Rolling: Min assist   Supine to sit: Mod assist, HOB elevated Sit to supine: Mod assist        Transfers Overall transfer level: Needs assistance Equipment used: 1 person hand held assist Transfers: Sit to/from Stand Sit to Stand: Mod assist, From elevated surface           General transfer comment: pt standing from tall stretcher, toes barely able to reach floor in sitting    Ambulation/Gait             Pre-gait activities: pt takes one side step at edge of bed, declines ambulation due to RLE pain    Stairs            Wheelchair Mobility    Modified Rankin (Stroke Patients Only)       Balance Overall balance assessment: Needs assistance Sitting-balance support: Single extremity supported, Bilateral upper extremity supported, Feet unsupported Sitting balance-Leahy Scale: Poor Sitting balance - Comments: posterior lean Postural control: Posterior lean Standing balance support: Bilateral upper extremity supported Standing balance-Leahy Scale: Poor Standing balance comment: minA  Pertinent Vitals/Pain Pain Assessment Pain Assessment: 0-10 Pain Score: 10-Worst pain ever Pain Location: RLE Pain Descriptors / Indicators: Sharp Pain Intervention(s): Limited activity within patient's tolerance    Home Living  Family/patient expects to be discharged to:: Private residence Living Arrangements: Alone Available Help at Discharge: Family;Available PRN/intermittently Type of Home: Apartment Home Access: Level entry       Home Layout: One level Home Equipment: Rollator (4 wheels)      Prior Function Prior Level of Function : Needs assist             Mobility Comments: ambulates for household distances with rollator ADLs Comments: bathes at sink, family assists with IADLs     Hand Dominance   Dominant Hand: Right    Extremity/Trunk Assessment   Upper Extremity Assessment Upper Extremity Assessment: Overall WFL for tasks assessed    Lower Extremity Assessment Lower Extremity Assessment: Generalized weakness    Cervical / Trunk Assessment Cervical / Trunk Assessment: Kyphotic;Other exceptions Cervical / Trunk Exceptions: morbid obesity  Communication   Communication: No difficulties  Cognition Arousal/Alertness: Awake/alert Behavior During Therapy: WFL for tasks assessed/performed Overall Cognitive Status: Impaired/Different from baseline Area of Impairment: Attention, Memory, Following commands, Safety/judgement, Awareness, Problem solving                   Current Attention Level: Sustained Memory: Decreased recall of precautions, Decreased short-term memory Following Commands: Follows one step commands with increased time Safety/Judgement: Decreased awareness of safety, Decreased awareness of deficits Awareness: Emergent Problem Solving: Slow processing, Difficulty sequencing          General Comments General comments (skin integrity, edema, etc.): VSS on RA    Exercises     Assessment/Plan    PT Assessment Patient needs continued PT services  PT Problem List Decreased strength;Decreased activity tolerance;Decreased balance;Decreased mobility;Decreased knowledge of use of DME;Decreased safety awareness;Decreased knowledge of precautions;Pain       PT  Treatment Interventions DME instruction;Gait training;Functional mobility training;Therapeutic activities;Therapeutic exercise;Balance training;Neuromuscular re-education;Cognitive remediation;Patient/family education    PT Goals (Current goals can be found in the Care Plan section)  Acute Rehab PT Goals Patient Stated Goal: to reduce pain in RLE PT Goal Formulation: With patient Time For Goal Achievement: 01/30/23 Potential to Achieve Goals: Fair    Frequency Min 3X/week (3x until inpatient PT confirmed)     Co-evaluation               AM-PAC PT "6 Clicks" Mobility  Outcome Measure Help needed turning from your back to your side while in a flat bed without using bedrails?: A Little Help needed moving from lying on your back to sitting on the side of a flat bed without using bedrails?: A Lot Help needed moving to and from a bed to a chair (including a wheelchair)?: A Lot Help needed standing up from a chair using your arms (e.g., wheelchair or bedside chair)?: A Lot Help needed to walk in hospital room?: Total Help needed climbing 3-5 steps with a railing? : Total 6 Click Score: 11    End of Session Equipment Utilized During Treatment: Gait belt Activity Tolerance: Patient limited by pain Patient left: in bed;with call bell/phone within reach Nurse Communication: Mobility status PT Visit Diagnosis: Other abnormalities of gait and mobility (R26.89);Muscle weakness (generalized) (M62.81);Pain Pain - Right/Left: Right Pain - part of body: Hip;Leg    Time: 1610-9604 PT Time Calculation (min) (ACUTE ONLY): 33 min   Charges:   PT Evaluation $PT Eval Low Complexity:  1 Low          Arlyss Gandy, PT, DPT Acute Rehabilitation Office [phone]   Arlyss Gandy 01/16/2023, 9:14 AM

## 2023-01-16 NOTE — Progress Notes (Signed)
TOC CSW contacted [facility] to make them aware that pt wants HH, therefore they won't be receiving pt tonight.  Jasminne Mealy Tarpley-Carter, MSW, LCSW-A Pronouns:  She/Her/Hers [facility] HealthTransitions of Care Clinical Social Worker Direct Number:  [phone] [email or web address]

## 2023-01-16 NOTE — Telephone Encounter (Signed)
Patients daughter called in to state that  there were no prescriptions given for pain or muscle relaxants. Messaged with Dr Doristine Counter regarding medications. They wish to  have them sent to [facility] in [facility].

## 2023-01-16 NOTE — Discharge Instructions (Addendum)
Follow up with your primary doctor to have your symptoms rechecked. Return to the ER for new or worsening symptoms.

## 2023-01-16 NOTE — ED Notes (Signed)
Bedside commode and wheelchair delivered  pt awaiting ptar

## 2023-01-16 NOTE — Discharge Planning (Signed)
Oletta Cohn, RN, BSN, [place] [phone] Pt qualifies for DME [facility]) wheelchair and bedside commode.  DME  ordered through [employer].  Jermaine of [employer] notified to deliver DME to pt room prior to D/C home.     Durable Medical Equipment  (From admission, onward)           Start     Ordered   01/16/23 0000  DME Bedside commode       Comments: Bariatric  Question:  Patient needs a bedside commode to treat with the following condition  Answer:  Weakness   01/16/23 1514

## 2023-01-16 NOTE — TOC Transition Note (Addendum)
Transition of Care [facility]) - CM/SW Discharge Note   Patient Details  Name: Holly Perkins MRN: [id number] Date of Birth: May 10, 1946  Transition of Care [facility]) CM/SW Contact:  Lockie Pares, RN Phone Number: 01/16/2023, 6:33 PM   Clinical Narrative:    EMS was called to take patient home, but they declined when they got to the ED to pick up the patient because she could sit in a wheelchair for transport. Called [facility] to see if they could provide WC transport. Spoke to [place], he will call me back shortly . He has to make sure he has a driver available. Nursing aware.  1838 Berna Spare from [facility] called back and they estimate pick up in a hour and a half. She needs her wheelchair for transport. Nursing aware.  1925 called [facility] and cancelled as the family decided they were going to try to transport home Final next level of care: Home/Self Care     Patient Goals and CMS Choice      Discharge Placement                         Discharge Plan and Services Additional resources added to the After Visit Summary for                                       Social Determinants of Health (SDOH) Interventions SDOH Screenings   Tobacco Use: Medium Risk (01/16/2023)     Readmission Risk Interventions     No data to display

## 2023-01-16 NOTE — ED Notes (Signed)
Patient is resting comfortably. 

## 2023-01-16 NOTE — ED Notes (Signed)
Pt asleep resting comfortably in hospital bed NAD will continue to monitor

## 2023-01-16 NOTE — ED Provider Notes (Signed)
Emergency Medicine Observation Re-evaluation Note  Holly Perkins is a 77 y.o. female, seen on rounds today.  Pt initially presented to the ED for complaints of Fall and level 2 FOT Currently, the patient is awake and alert, PT at bedside.  No complaints this morning.  Physical Exam  BP (!) 178/114 (BP Location: Right Arm)   Pulse (!) 53   Temp 98.4 F (36.9 C) (Oral)   Resp 15   Ht 5\' 2"  (1.575 m)   Wt 124.7 kg   SpO2 100%   BMI 50.30 kg/m  Physical Exam General: Awake and alert, no acute distress Cardiac: Mildly bradycardic Lungs: No increased work of breathing Psych: Calm, cooperative  ED Course / MDM  EKG:EKG Interpretation  Date/Time:  Thursday Jan 15 2023 13:54:30 EDT Ventricular Rate:  39 PR Interval:  158 QRS Duration: 114 QT Interval:  482 QTC Calculation: 389 R Axis:   17 Text Interpretation: Sinus bradycardia Borderline intraventricular conduction delay Abnormal R-wave progression, early transition no acute ischemic appearance, no sig change fom previous except slower rate. Confirmed by Arby Barrette [phone]) on 01/15/2023 4:59:26 PM  I have reviewed the labs performed to date as well as medications administered while in observation.  Recent changes in the last 24 hours include patient is medically cleared and plan is for PT eval today for possible SNF placement.  Plan  Current plan is for PT eval for SNF.    Rexford Maus, DO 01/16/23 [phone]

## 2023-01-16 NOTE — ED Notes (Signed)
Pt is confused pt repeatedly asking for her glasses

## 2023-01-16 NOTE — Progress Notes (Addendum)
This CSW received a call from pt's saughter, Ines Bloomer, who reports they wish to accept [facility]'s bed offer. This CSW has notified [facility]. TOC to initiate Auth.  Addend @ 11:12  Auth initiated.    Addend @ 12:56 PM Auth still pending. [facility] reports pt can admit over the weekend if Auth is received.  Addend @ 1:05 PM Auth approved. Pt can d/c today to [facility]. PTAR to transport. Daughter and pt notified.

## 2023-01-16 NOTE — ED Notes (Signed)
Pt resting on stretcher NAD awaiting PTAR

## 2023-01-16 NOTE — ED Notes (Signed)
PT at bedside.

## 2023-03-19 ENCOUNTER — Emergency Department (HOSPITAL_BASED_OUTPATIENT_CLINIC_OR_DEPARTMENT_OTHER)
Admission: EM | Admit: 2023-03-19 | Discharge: 2023-03-19 | Disposition: A | Discharge status: Home / Self Care | Payer: 59 | Attending: Emergency Medicine | Admitting: Emergency Medicine

## 2023-03-19 ENCOUNTER — Emergency Department (HOSPITAL_BASED_OUTPATIENT_CLINIC_OR_DEPARTMENT_OTHER): Payer: 59

## 2023-03-19 ENCOUNTER — Other Ambulatory Visit: Payer: Self-pay

## 2023-03-19 ENCOUNTER — Encounter (HOSPITAL_BASED_OUTPATIENT_CLINIC_OR_DEPARTMENT_OTHER): Payer: Self-pay

## 2023-03-19 DIAGNOSIS — M545 Low back pain, unspecified: Secondary | ICD-10-CM | POA: Diagnosis present

## 2023-03-19 DIAGNOSIS — N183 Chronic kidney disease, stage 3 unspecified: Secondary | ICD-10-CM | POA: Insufficient documentation

## 2023-03-19 DIAGNOSIS — E119 Type 2 diabetes mellitus without complications: Secondary | ICD-10-CM | POA: Insufficient documentation

## 2023-03-19 DIAGNOSIS — I129 Hypertensive chronic kidney disease with stage 1 through stage 4 chronic kidney disease, or unspecified chronic kidney disease: Secondary | ICD-10-CM | POA: Insufficient documentation

## 2023-03-19 DIAGNOSIS — R1031 Right lower quadrant pain: Secondary | ICD-10-CM | POA: Diagnosis not present

## 2023-03-19 DIAGNOSIS — M5441 Lumbago with sciatica, right side: Secondary | ICD-10-CM | POA: Diagnosis not present

## 2023-03-19 DIAGNOSIS — G8929 Other chronic pain: Secondary | ICD-10-CM | POA: Diagnosis not present

## 2023-03-19 DIAGNOSIS — Z79899 Other long term (current) drug therapy: Secondary | ICD-10-CM | POA: Insufficient documentation

## 2023-03-19 DIAGNOSIS — Z7984 Long term (current) use of oral hypoglycemic drugs: Secondary | ICD-10-CM | POA: Diagnosis not present

## 2023-03-19 DIAGNOSIS — Z87891 Personal history of nicotine dependence: Secondary | ICD-10-CM | POA: Insufficient documentation

## 2023-03-19 LAB — CBC WITH DIFFERENTIAL/PLATELET
Abs Immature Granulocytes: 0.04 10*3/uL (ref 0.00–0.07)
Basophils Absolute: 0 10*3/uL (ref 0.0–0.1)
Basophils Relative: 0 %
Eosinophils Absolute: 0.1 10*3/uL (ref 0.0–0.5)
Eosinophils Relative: 1 %
HCT: 38.2 % (ref 36.0–46.0)
Hemoglobin: 12.3 g/dL (ref 12.0–15.0)
Immature Granulocytes: 0 %
Lymphocytes Relative: 22 %
Lymphs Abs: 2 10*3/uL (ref 0.7–4.0)
MCH: 26.5 pg (ref 26.0–34.0)
MCHC: 32.2 g/dL (ref 30.0–36.0)
MCV: 82.2 fL (ref 80.0–100.0)
Monocytes Absolute: 0.8 10*3/uL (ref 0.1–1.0)
Monocytes Relative: 8 %
Neutro Abs: 6.2 10*3/uL (ref 1.7–7.7)
Neutrophils Relative %: 69 %
Platelets: 198 10*3/uL (ref 150–400)
RBC: 4.65 MIL/uL (ref 3.87–5.11)
RDW: 16.9 % — ABNORMAL HIGH (ref 11.5–15.5)
WBC: 9.1 10*3/uL (ref 4.0–10.5)
nRBC: 0 % (ref 0.0–0.2)

## 2023-03-19 LAB — COMPREHENSIVE METABOLIC PANEL
ALT: 15 U/L (ref 0–44)
AST: 22 U/L (ref 15–41)
Albumin: 3.8 g/dL (ref 3.5–5.0)
Alkaline Phosphatase: 72 U/L (ref 38–126)
Anion gap: 9 (ref 5–15)
BUN: 13 mg/dL (ref 8–23)
CO2: 24 mmol/L (ref 22–32)
Calcium: 9.6 mg/dL (ref 8.9–10.3)
Chloride: 106 mmol/L (ref 98–111)
Creatinine, Ser: 0.92 mg/dL (ref 0.44–1.00)
GFR, Estimated: >60 mL/min (ref >60)
Glucose, Bld: 123 mg/dL — ABNORMAL HIGH (ref 70–99)
Potassium: 3.9 mmol/L (ref 3.5–5.1)
Sodium: 139 mmol/L (ref 135–145)
Total Bilirubin: 0.8 mg/dL (ref 0.3–1.2)
Total Protein: 6.9 g/dL (ref 6.5–8.1)

## 2023-03-19 LAB — LIPASE, BLOOD: Lipase: 27 U/L (ref 11–51)

## 2023-03-19 MED ORDER — KETOROLAC TROMETHAMINE 15 MG/ML IJ SOLN
15.0000 mg | Freq: Once | INTRAMUSCULAR | Status: AC
  Administered 2023-03-19: 15 mg via INTRAVENOUS
  Filled 2023-03-19: qty 1

## 2023-03-19 MED ORDER — OXYCODONE HCL 5 MG PO TABS: 5.0000 mg | ORAL_TABLET | Freq: Four times a day (QID) | ORAL | 0 refills | Status: AC | PRN

## 2023-03-19 MED ORDER — CYCLOBENZAPRINE HCL 5 MG PO TABS: 5.0000 mg | ORAL_TABLET | Freq: Two times a day (BID) | ORAL | 0 refills | Status: AC | PRN

## 2023-03-19 MED ORDER — MORPHINE SULFATE (PF) 4 MG/ML IV SOLN
4.0000 mg | Freq: Once | INTRAVENOUS | Status: AC
  Administered 2023-03-19: 4 mg via INTRAVENOUS
  Filled 2023-03-19: qty 1

## 2023-03-19 MED ORDER — SODIUM CHLORIDE 0.9 % IV BOLUS
1000.0000 mL | Freq: Once | INTRAVENOUS | Status: AC
  Administered 2023-03-19: 1000 mL via INTRAVENOUS

## 2023-03-19 MED ORDER — IBUPROFEN 600 MG PO TABS: 600.0000 mg | ORAL_TABLET | Freq: Four times a day (QID) | ORAL | 0 refills | Status: AC | PRN

## 2023-03-19 MED ORDER — ACETAMINOPHEN 325 MG PO TABS
650.0000 mg | ORAL_TABLET | Freq: Once | ORAL | Status: AC
  Administered 2023-03-19: 650 mg via ORAL
  Filled 2023-03-19: qty 2

## 2023-03-19 MED ORDER — LIDOCAINE 5 % EX PTCH
1.0000 | MEDICATED_PATCH | Freq: Once | CUTANEOUS | Status: DC
  Administered 2023-03-19: 1 via TRANSDERMAL
  Filled 2023-03-19: qty 1

## 2023-03-19 MED ORDER — ACETAMINOPHEN 325 MG PO TABS: 650.0000 mg | ORAL_TABLET | Freq: Four times a day (QID) | ORAL | 0 refills | Status: AC | PRN

## 2023-03-19 MED ORDER — ONDANSETRON HCL 4 MG/2ML IJ SOLN
4.0000 mg | Freq: Once | INTRAMUSCULAR | Status: AC
  Administered 2023-03-19: 4 mg via INTRAVENOUS
  Filled 2023-03-19: qty 2

## 2023-03-19 MED ORDER — OXYCODONE-ACETAMINOPHEN 5-325 MG PO TABS
1.0000 | ORAL_TABLET | Freq: Once | ORAL | Status: AC
  Administered 2023-03-19: 1 via ORAL
  Filled 2023-03-19: qty 1

## 2023-03-19 MED ORDER — LIDOCAINE 5 % EX PTCH: 1.0000 | MEDICATED_PATCH | Freq: Every day | CUTANEOUS | 0 refills | Status: AC | PRN

## 2023-03-19 NOTE — ED Provider Notes (Signed)
[facility] Provider Note  CSN: [id number] Arrival date & time: 03/19/23 4098  Chief Complaint(s) Flank Pain and Hip Pain  HPI Holly Perkins is a 77 y.o. female with past medical history as below, significant for CKD stage III, DM, HLD, HTN, PVD who presents to the ED with complaint of right lower quadrant, right flank pain.  Onset 2 days ago, right flank rating to right lower quadrant, sharp and stabbing, paroxysmal pain.  Nausea without vomiting.  No change in bowel or bladder function, no history of kidney stone.  No trauma.  No numbness to her lower extremities.  Difficulty getting into bed secondary to severe pain right side.  No chest pain or dyspnea.  No fevers.  No leg numbness or change in bowel or bladder function, no overflow incontinence, no fevers  Past Medical History Past Medical History:  Diagnosis Date   Chest pain, atypical    Chronic renal failure    stage 3   Diabetes mellitus without complication (HCC)    Diverticulitis    High cholesterol    Hyperlipidemia    Hypertension    Peripheral vascular disease (HCC)    There are no problems to display for this patient.  Home Medication(s) Prior to Admission medications   Medication Sig Start Date End Date Taking? Authorizing Provider  acetaminophen (TYLENOL) 325 MG tablet Take 2 tablets (650 mg total) by mouth every 6 (six) hours as needed. 03/19/23  Yes Sloan Leiter, DO  cyclobenzaprine (FLEXERIL) 5 MG tablet Take 1 tablet (5 mg total) by mouth 2 (two) times daily as needed for muscle spasms. 03/19/23  Yes Tanda Rockers A, DO  ibuprofen (ADVIL) 600 MG tablet Take 1 tablet (600 mg total) by mouth every 6 (six) hours as needed. 03/19/23  Yes Tanda Rockers A, DO  lidocaine (LIDODERM) 5 % Place 1 patch onto the skin daily as needed. Remove & Discard patch within 12 hours or as directed by MD 03/19/23  Yes Tanda Rockers A, DO  oxyCODONE (ROXICODONE) 5 MG immediate release tablet  Take 1 tablet (5 mg total) by mouth every 6 (six) hours as needed for severe pain. 03/19/23  Yes Tanda Rockers A, DO  allopurinol (ZYLOPRIM) 100 MG tablet Take 100 mg by mouth daily.    [provider]  B Complex Vitamins (B-COMPLEX/B-12 PO) Take by mouth.      [provider]  benzonatate (TESSALON) 100 MG capsule Take 1 capsule (100 mg total) by mouth every 8 (eight) hours. 09/17/14   Eber Hong, MD  clopidogrel (PLAVIX) 75 MG tablet Take 75 mg by mouth daily.      [provider]  ergocalciferol (VITAMIN D2) 50000 UNITS capsule Take 50,000 Units by mouth once a week.    [provider]  furosemide (LASIX) 40 MG tablet Take 1 tablet (40 mg total) by mouth daily. 01/02/20   Horton, Mayer Masker, MD  hydrochlorothiazide (HYDRODIURIL) 25 MG tablet Take 25 mg by mouth daily.      [provider]  HYDROcodone-acetaminophen (NORCO/VICODIN) 5-325 MG tablet Take 1-2 tablets by mouth every 6 (six) hours as needed. 06/26/19   Maxwell Caul, PA-C  levofloxacin (LEVAQUIN) 500 MG tablet Take 500 mg by mouth daily.    [provider]  lisinopril (PRINIVIL,ZESTRIL) 40 MG tablet Take 40 mg by mouth daily.      [provider]  metFORMIN (GLUCOPHAGE) 500 MG tablet Take by mouth 2 (two) times daily with  a meal.    [provider]  methylPREDNISolone (MEDROL DOSEPAK) 4 MG TBPK tablet Use as directed 09/24/22   Charlynne Pander, MD  metroNIDAZOLE (FLAGYL) 500 MG tablet Take 500 mg by mouth 3 (three) times daily.    [provider]  omeprazole (PRILOSEC) 20 MG capsule Take 1 capsule (20 mg total) by mouth 2 (two) times daily. 03/04/17   Rolland Porter, MD  ondansetron (ZOFRAN ODT) 4 MG disintegrating tablet Take 1 tablet (4 mg total) by mouth every 8 (eight) hours as needed for nausea. 09/17/14   Eber Hong, MD  ondansetron (ZOFRAN-ODT) 4 MG disintegrating tablet 4mg  ODT q4 hours prn nausea/vomit 09/24/22   Charlynne Pander, MD   oxyCODONE (OXYCONTIN) 10 MG 12 hr tablet Take 10 mg by mouth every 4 (four) hours as needed.    [provider]  oxyCODONE-acetaminophen (PERCOCET) 5-325 MG tablet Take 1 tablet by mouth every 6 (six) hours as needed. 01/16/23   Jacalyn Lefevre, MD  pravastatin (PRAVACHOL) 20 MG tablet Take 10 mg by mouth daily.     [provider]  predniSONE (STERAPRED UNI-PAK 21 TAB) 10 MG (21) TBPK tablet Take by mouth daily. Take 6 tabs by mouth daily  for 2 days, then 5 tabs for 2 days, then 4 tabs for 2 days, then 3 tabs for 2 days, 2 tabs for 2 days, then 1 tab by mouth daily for 2 days 08/30/18   Emi Holes, PA-C                                                                                                                                    Past Surgical History Past Surgical History:  Procedure Laterality Date   ABDOMINAL HYSTERECTOMY     FEMORAL ARTERY STENT     Family History History reviewed. No pertinent family history.  Social History Social History   Tobacco Use   Smoking status: Former    Current packs/day: 0.00    Types: Cigarettes    Quit date: 09/08/2008    Years since quitting: 14.5   Smokeless tobacco: Never  Substance Use Topics   Alcohol use: No   Drug use: No   Allergies Hydrocodone-acetaminophen  Review of Systems Review of Systems  Constitutional:  Negative for chills and fever.  Respiratory:  Negative for shortness of breath.   Cardiovascular:  Negative for chest pain.  Gastrointestinal:  Positive for abdominal pain and nausea. Negative for vomiting.  Genitourinary:  Positive for flank pain.  Musculoskeletal:  Positive for back pain.  All other systems reviewed and are negative.   Physical Exam Vital Signs  I have reviewed the triage vital signs BP (!) 153/74   Pulse (!) 45   Temp 98.7 F (37.1 C) (Oral)   Resp 20   Ht 5\' 2"  (1.575 m)   Wt 129.3 kg   SpO2 96%   BMI 52.13 kg/m  Physical  Exam Vitals and nursing note reviewed.   Constitutional:      General: She is not in acute distress.    Appearance: Normal appearance. She is obese. She is not ill-appearing.  HENT:     Head: Normocephalic and atraumatic.     Right Ear: External ear normal.     Left Ear: External ear normal.     Nose: Nose normal.     Mouth/Throat:     Mouth: Mucous membranes are moist.  Eyes:     General: No scleral icterus.       Right eye: No discharge.        Left eye: No discharge.  Cardiovascular:     Rate and Rhythm: Normal rate and regular rhythm.     Pulses: Normal pulses.     Heart sounds: Normal heart sounds.  Pulmonary:     Effort: Pulmonary effort is normal. No respiratory distress.     Breath sounds: Normal breath sounds.  Abdominal:     General: Abdomen is flat.     Palpations: Abdomen is soft.     Tenderness: There is abdominal tenderness in the right lower quadrant. There is no guarding or rebound.     Comments: Rlq Right flank Ttp   Musculoskeletal:       Arms:     Cervical back: No rigidity.     Right lower leg: No edema.     Left lower leg: No edema.  Skin:    General: Skin is warm and dry.     Capillary Refill: Capillary refill takes less than 2 seconds.  Neurological:     Mental Status: She is alert and oriented to person, place, and time.     GCS: GCS eye subscore is 4. GCS verbal subscore is 5. GCS motor subscore is 6.     Comments: Strength 5/5 bilateral lower extremities, LE NVI  Psychiatric:        Mood and Affect: Mood normal.        Behavior: Behavior normal.     ED Results and Treatments Labs (all labs ordered are listed, but only abnormal results are displayed) Labs Reviewed  CBC WITH DIFFERENTIAL/PLATELET - Abnormal; Notable for the following components:      Result Value   RDW 16.9 (*)    All other components within normal limits  COMPREHENSIVE METABOLIC PANEL - Abnormal; Notable for the following components:   Glucose, Bld 123 (*)    All other components within normal limits   LIPASE, BLOOD                                                                                                                          Radiology DG Chest Portable 1 View  Result Date: 03/19/2023 CLINICAL DATA:  abn ct, ?pna b/l lower EXAM: PORTABLE CHEST 1 VIEW COMPARISON:  Same day CT.  Jan 15, 2023 FINDINGS: The cardiomediastinal silhouette is unchanged and enlarged in contour.Atherosclerotic calcifications. No pleural effusion. No  pneumothorax. No acute pleuroparenchymal abnormality. IMPRESSION: No acute cardiopulmonary abnormality. Retrocardiac opacities seen on same day CT are not radiographically visible. Electronically Signed   By: Meda Klinefelter M.D.   On: 03/19/2023 13:22   CT Renal Stone Study  Result Date: 03/19/2023 CLINICAL DATA:  Abdominal pain, right flank pain EXAM: CT ABDOMEN AND PELVIS WITHOUT CONTRAST TECHNIQUE: Multidetector CT imaging of the abdomen and pelvis was performed following the standard protocol without IV contrast. RADIATION DOSE REDUCTION: This exam was performed according to the departmental dose-optimization program which includes automated exposure control, adjustment of the mA and/or kV according to patient size and/or use of iterative reconstruction technique. COMPARISON:  01/27/2020 FINDINGS: Lower chest: There are scattered coronary artery calcifications. Small patchy densities are seen in posterior lower lung fields, more so on the left side. Hepatobiliary: There is no dilation of bile ducts. Gallbladder is unremarkable. Pancreas: No focal abnormalities are seen. Spleen: Unremarkable. Adrenals/Urinary Tract: Adrenals are unremarkable. There is no hydronephrosis. There are no renal or ureteral stones. Urinary bladder is unremarkable. Stomach/Bowel: Small hiatal hernia is seen. Small bowel loops are not dilated. Appendix is not dilated. There is no significant wall thickening in colon. Multiple diverticula seen in colon without signs of focal diverticulitis.  Surgical staples are seen in sigmoid colon. Vascular/Lymphatic: Arterial calcifications are seen in aorta and its major branches. Vascular stent is noted in left femoral artery. Reproductive: Uterus is not seen. Other: There is no ascites or pneumoperitoneum. Small umbilical hernia containing fat is seen. Musculoskeletal: Degenerative changes are noted in lumbar spine with spinal stenosis at L3-L4 and L4-L5 levels. There is encroachment of neural foramina at multiple levels in lumbar spine. IMPRESSION: There is no evidence of intestinal obstruction or pneumoperitoneum. There is no hydronephrosis. Appendix is not dilated. Small patchy infiltrates are seen in the lower lung fields, more so on the left side. This finding may suggest scarring or small foci of atelectasis/pneumonia. Diverticulosis of colon without signs of diverticulitis. Small hiatal hernia. Coronary artery disease. Aortic arteriosclerosis. Lumbar spondylosis. Electronically Signed   By: Ernie Avena M.D.   On: 03/19/2023 12:04    Pertinent labs & imaging results that were available during my care of the patient were reviewed by me and considered in my medical decision making (see MDM for details).  Medications Ordered in ED Medications  sodium chloride 0.9 % bolus 1,000 mL (0 mLs Intravenous Stopped 03/19/23 1457)  morphine (PF) 4 MG/ML injection 4 mg (4 mg Intravenous Given 03/19/23 1036)  ondansetron (ZOFRAN) injection 4 mg (4 mg Intravenous Given 03/19/23 1037)  ketorolac (TORADOL) 15 MG/ML injection 15 mg (15 mg Intravenous Given 03/19/23 1250)  oxyCODONE-acetaminophen (PERCOCET/ROXICET) 5-325 MG per tablet 1 tablet (1 tablet Oral Given 03/19/23 1251)  acetaminophen (TYLENOL) tablet 650 mg (650 mg Oral Given 03/19/23 1359)  Procedures Procedures  (including critical care time)  Medical Decision  Making / ED Course    Medical Decision Making:    Charee Tumblin is a 77 y.o. female with past medical history as below, significant for CKD stage III, DM, HLD, HTN, PVD who presents to the ED with complaint of right lower quadrant, right flank pain. . The complaint involves an extensive differential diagnosis and also carries with it a high risk of complications and morbidity.  Serious etiology was considered. Ddx includes but is not limited to: Differential diagnosis includes but is not exclusive to ectopic pregnancy, ovarian cyst, ovarian torsion, acute appendicitis, urinary tract infection, endometriosis, bowel obstruction, hernia, colitis, renal colic, gastroenteritis, volvulus etc.   Complete initial physical exam performed, notably the patient  was abdomen soft, nonperitoneal.  Tender right lower quadrant, right flank.  Appears to be in some degree of pain, HDS.    Reviewed and confirmed nursing documentation for past medical history, family history, social history.  Vital signs reviewed.    Clinical Course as of 03/20/23 0726  Thu Mar 19, 2023  1238 Pt asleep on stretcher [SG]  1352 Feeling better, ambulatory, stable for dc. Advised to f/u w/ pcp and spine specialists  [SG]    Clinical Course User Index [SG] Sloan Leiter, DO   Labs reviewed and are stable, imaging reviewed also stable. She is feeling better.   Favor symptoms today secondary to her chronic back pain.  Advise she follow back up with spine specialist and PCP.  Will give analgesics for home. Multi modal pain control   Patient presents with low back pain without signs of spinal cord compression, cauda equina syndrome, infection, aneurysm, or other serious etiology. The patient is neurologically intact. Given the extremely low risk of these diagnoses further testing and evaluation for these possibilities does not appear to be indicated at this time. Detailed discussions were had with the patient and/or family and  caregivers, regarding current findings, and need for close f/u with PCP or on call doctor. The patient has been instructed to return immediately if the symptoms worsen in any way. Patient verbalized understanding and is in agreement with current care plan. All questions answered prior to discharge.      Additional history obtained: -Additional history obtained from daughter -External records from outside source obtained and reviewed including: Chart review including previous notes, labs, imaging, consultation notes including prior labs and imaging, medications, prior ED visits Evaluated 1/17 with lumbar radiculopathy in the ER Evaluated 5/9 after a fall, low back pain  Lab Tests: -I ordered, reviewed, and interpreted labs.   The pertinent results include:   Labs Reviewed  CBC WITH DIFFERENTIAL/PLATELET - Abnormal; Notable for the following components:      Result Value   RDW 16.9 (*)    All other components within normal limits  COMPREHENSIVE METABOLIC PANEL - Abnormal; Notable for the following components:   Glucose, Bld 123 (*)    All other components within normal limits  LIPASE, BLOOD    Notable for stable labs  EKG   EKG Interpretation Date/Time:    Ventricular Rate:    PR Interval:    QRS Duration:    QT Interval:    QTC Calculation:   R Axis:      Text Interpretation:           Imaging Studies ordered: I ordered imaging studies including ct renal, cxr I independently visualized the following imaging with scope of interpretation limited to  determining acute life threatening conditions related to emergency care; findings noted above, significant for ?infiltrate lower lobes, she has no s/s PNA, favor atelectasis I independently visualized and interpreted imaging. I agree with the radiologist interpretation   Medicines ordered and prescription drug management: Meds ordered this encounter  Medications   sodium chloride 0.9 % bolus 1,000 mL   morphine (PF) 4  MG/ML injection 4 mg   ondansetron (ZOFRAN) injection 4 mg   ketorolac (TORADOL) 15 MG/ML injection 15 mg   DISCONTD: lidocaine (LIDODERM) 5 % 1 patch   oxyCODONE-acetaminophen (PERCOCET/ROXICET) 5-325 MG per tablet 1 tablet   acetaminophen (TYLENOL) tablet 650 mg   oxyCODONE (ROXICODONE) 5 MG immediate release tablet    Sig: Take 1 tablet (5 mg total) by mouth every 6 (six) hours as needed for severe pain.    Dispense:  10 tablet    Refill:  0   acetaminophen (TYLENOL) 325 MG tablet    Sig: Take 2 tablets (650 mg total) by mouth every 6 (six) hours as needed.    Dispense:  36 tablet    Refill:  0   ibuprofen (ADVIL) 600 MG tablet    Sig: Take 1 tablet (600 mg total) by mouth every 6 (six) hours as needed.    Dispense:  30 tablet    Refill:  0   lidocaine (LIDODERM) 5 %    Sig: Place 1 patch onto the skin daily as needed. Remove & Discard patch within 12 hours or as directed by MD    Dispense:  15 patch    Refill:  0   cyclobenzaprine (FLEXERIL) 5 MG tablet    Sig: Take 1 tablet (5 mg total) by mouth 2 (two) times daily as needed for muscle spasms.    Dispense:  20 tablet    Refill:  0   DISCONTD: lidocaine (LIDODERM) 5 % 1 patch    -I have reviewed the patients home medicines and have made adjustments as needed   Consultations Obtained: na   Cardiac Monitoring: The patient was maintained on a cardiac monitor.  I personally viewed and interpreted the cardiac monitored which showed an underlying rhythm of: NSR  Social Determinants of Health:  Diagnosis or treatment significantly limited by social determinants of health: obesity   Reevaluation: After the interventions noted above, I reevaluated the patient and found that they have improved  Co morbidities that complicate the patient evaluation  Past Medical History:  Diagnosis Date   Chest pain, atypical    Chronic renal failure    stage 3   Diabetes mellitus without complication (HCC)    Diverticulitis    High  cholesterol    Hyperlipidemia    Hypertension    Peripheral vascular disease (HCC)       Dispostion: Disposition decision including need for hospitalization was considered, and patient discharged from emergency department.    Final Clinical Impression(s) / ED Diagnoses Final diagnoses:  Chronic right-sided low back pain with right-sided sciatica     This chart was dictated using voice recognition software.  Despite best efforts to proofread,  errors can occur which can change the documentation meaning.    Sloan Leiter, DO 03/20/23 [phone]

## 2023-03-19 NOTE — ED Triage Notes (Signed)
Pt presents via pov with complaints of right sided flank that radiates to lower abdomen x 2 days. Pt also reports that her urine has been a dark color and order x 2 weeks. Also reports fall X 2 in the past few days.

## 2023-03-19 NOTE — Discharge Instructions (Addendum)
It was a pleasure caring for you today in the emergency department.  Please return to the emergency department for any worsening or worrisome symptoms.  Please follow up with your spine specialist and PCP  PCP may help coordinate short term rehab/nursing home placement if you would like to pursue that in the future

## 2023-04-26 ENCOUNTER — Emergency Department (HOSPITAL_BASED_OUTPATIENT_CLINIC_OR_DEPARTMENT_OTHER): Payer: 59

## 2023-04-26 ENCOUNTER — Other Ambulatory Visit: Payer: Self-pay

## 2023-04-26 ENCOUNTER — Emergency Department (HOSPITAL_BASED_OUTPATIENT_CLINIC_OR_DEPARTMENT_OTHER)
Admission: EM | Admit: 2023-04-26 | Discharge: 2023-04-27 | Disposition: A | Discharge status: Home / Self Care | Payer: 59 | Attending: Emergency Medicine | Admitting: Emergency Medicine

## 2023-04-26 ENCOUNTER — Encounter (HOSPITAL_BASED_OUTPATIENT_CLINIC_OR_DEPARTMENT_OTHER): Payer: Self-pay

## 2023-04-26 DIAGNOSIS — Z7901 Long term (current) use of anticoagulants: Secondary | ICD-10-CM | POA: Diagnosis not present

## 2023-04-26 DIAGNOSIS — Z7984 Long term (current) use of oral hypoglycemic drugs: Secondary | ICD-10-CM | POA: Diagnosis not present

## 2023-04-26 DIAGNOSIS — R600 Localized edema: Secondary | ICD-10-CM | POA: Diagnosis not present

## 2023-04-26 DIAGNOSIS — R072 Precordial pain: Secondary | ICD-10-CM | POA: Diagnosis not present

## 2023-04-26 DIAGNOSIS — E1122 Type 2 diabetes mellitus with diabetic chronic kidney disease: Secondary | ICD-10-CM | POA: Diagnosis not present

## 2023-04-26 DIAGNOSIS — M7989 Other specified soft tissue disorders: Secondary | ICD-10-CM | POA: Diagnosis not present

## 2023-04-26 DIAGNOSIS — I129 Hypertensive chronic kidney disease with stage 1 through stage 4 chronic kidney disease, or unspecified chronic kidney disease: Secondary | ICD-10-CM | POA: Diagnosis not present

## 2023-04-26 DIAGNOSIS — R079 Chest pain, unspecified: Secondary | ICD-10-CM

## 2023-04-26 DIAGNOSIS — N183 Chronic kidney disease, stage 3 unspecified: Secondary | ICD-10-CM | POA: Diagnosis not present

## 2023-04-26 LAB — BASIC METABOLIC PANEL
Anion gap: 6 (ref 5–15)
BUN: 20 mg/dL (ref 8–23)
CO2: 26 mmol/L (ref 22–32)
Calcium: 9 mg/dL (ref 8.9–10.3)
Chloride: 109 mmol/L (ref 98–111)
Creatinine, Ser: 1.14 mg/dL — ABNORMAL HIGH (ref 0.44–1.00)
GFR, Estimated: 51 mL/min — ABNORMAL LOW (ref >60)
Glucose, Bld: 98 mg/dL (ref 70–99)
Potassium: 4.2 mmol/L (ref 3.5–5.1)
Sodium: 141 mmol/L (ref 135–145)

## 2023-04-26 LAB — CBC
HCT: 36 % (ref 36.0–46.0)
Hemoglobin: 11.4 g/dL — ABNORMAL LOW (ref 12.0–15.0)
MCH: 26.7 pg (ref 26.0–34.0)
MCHC: 31.7 g/dL (ref 30.0–36.0)
MCV: 84.3 fL (ref 80.0–100.0)
Platelets: 197 10*3/uL (ref 150–400)
RBC: 4.27 MIL/uL (ref 3.87–5.11)
RDW: 17.3 % — ABNORMAL HIGH (ref 11.5–15.5)
WBC: 8.4 10*3/uL (ref 4.0–10.5)
nRBC: 0 % (ref 0.0–0.2)

## 2023-04-26 LAB — TROPONIN I (HIGH SENSITIVITY)
Troponin I (High Sensitivity): 8 ng/L (ref <18)
Troponin I (High Sensitivity): 9 ng/L (ref <18)

## 2023-04-26 LAB — BRAIN NATRIURETIC PEPTIDE: B Natriuretic Peptide: 238.3 pg/mL — ABNORMAL HIGH (ref 0.0–100.0)

## 2023-04-26 MED ORDER — IOHEXOL 350 MG/ML SOLN
100.0000 mL | Freq: Once | INTRAVENOUS | Status: AC | PRN
  Administered 2023-04-27: 80 mL via INTRAVENOUS

## 2023-04-26 MED ORDER — FUROSEMIDE 10 MG/ML IJ SOLN
40.0000 mg | Freq: Once | INTRAMUSCULAR | Status: DC
  Filled 2023-04-26: qty 4

## 2023-04-26 NOTE — ED Notes (Signed)
Transport to imaging approved beforehand by Aaron Edelman, RN.

## 2023-04-26 NOTE — ED Provider Notes (Signed)
[place] [facility] Provider Note  CSN: [id number] Arrival date & time: 04/26/23 2113  Chief Complaint(s) Chest Pain  HPI Holly Perkins is a 77 y.o. female history of CKD, diabetes, hypertension, hyperlipidemia, peripheral vascular disease presenting with chest pain.  Patient reports around 1 week of chest pain.  Also reports some shortness of breath with exertion, leg swelling.  She is prescribed Lasix but she does not take it.  No recent travel or surgeries.  Chest pain not pleuritic.  She reports that it is on both sides of her chest.  She reports a cough but is unable to get anything up.  No fevers or chills.  No nausea or vomiting.  Symptoms mild.   Past Medical History Past Medical History:  Diagnosis Date   Chest pain, atypical    Chronic renal failure    stage 3   Diabetes mellitus without complication (HCC)    Diverticulitis    High cholesterol    Hyperlipidemia    Hypertension    Peripheral vascular disease (HCC)    There are no problems to display for this patient.  Home Medication(s) Prior to Admission medications   Medication Sig Start Date End Date Taking? Authorizing Provider  acetaminophen (TYLENOL) 325 MG tablet Take 2 tablets (650 mg total) by mouth every 6 (six) hours as needed. 03/19/23   Sloan Leiter, DO  allopurinol (ZYLOPRIM) 100 MG tablet Take 100 mg by mouth daily.    [provider]  B Complex Vitamins (B-COMPLEX/B-12 PO) Take by mouth.      [provider]  benzonatate (TESSALON) 100 MG capsule Take 1 capsule (100 mg total) by mouth every 8 (eight) hours. 09/17/14   Eber Hong, MD  clopidogrel (PLAVIX) 75 MG tablet Take 75 mg by mouth daily.      [provider]  cyclobenzaprine (FLEXERIL) 5 MG tablet Take 1 tablet (5 mg total) by mouth 2 (two) times daily as needed for muscle spasms. 03/19/23   Sloan Leiter, DO  ergocalciferol (VITAMIN D2) 50000 UNITS capsule Take 50,000 Units by  mouth once a week.    [provider]  furosemide (LASIX) 40 MG tablet Take 1 tablet (40 mg total) by mouth daily. 01/02/20   Horton, Mayer Masker, MD  hydrochlorothiazide (HYDRODIURIL) 25 MG tablet Take 25 mg by mouth daily.      [provider]  HYDROcodone-acetaminophen (NORCO/VICODIN) 5-325 MG tablet Take 1-2 tablets by mouth every 6 (six) hours as needed. 06/26/19   Maxwell Caul, PA-C  ibuprofen (ADVIL) 600 MG tablet Take 1 tablet (600 mg total) by mouth every 6 (six) hours as needed. 03/19/23   Sloan Leiter, DO  levofloxacin (LEVAQUIN) 500 MG tablet Take 500 mg by mouth daily.    [provider]  lidocaine (LIDODERM) 5 % Place 1 patch onto the skin daily as needed. Remove & Discard patch within 12 hours or as directed by MD 03/19/23   Tanda Rockers A, DO  lisinopril (PRINIVIL,ZESTRIL) 40 MG tablet Take 40 mg by mouth daily.      [provider]  metFORMIN (GLUCOPHAGE) 500 MG tablet Take by mouth 2 (two) times daily with a meal.    [provider]  methylPREDNISolone (MEDROL DOSEPAK) 4 MG TBPK tablet Use as directed 09/24/22   Charlynne Pander, MD  metroNIDAZOLE (FLAGYL) 500 MG tablet Take 500 mg by mouth 3 (three) times daily.    [provider]  omeprazole (PRILOSEC) 20  MG capsule Take 1 capsule (20 mg total) by mouth 2 (two) times daily. 03/04/17   Rolland Porter, MD  ondansetron (ZOFRAN ODT) 4 MG disintegrating tablet Take 1 tablet (4 mg total) by mouth every 8 (eight) hours as needed for nausea. 09/17/14   Eber Hong, MD  ondansetron (ZOFRAN-ODT) 4 MG disintegrating tablet 4mg  ODT q4 hours prn nausea/vomit 09/24/22   Charlynne Pander, MD  oxyCODONE (OXYCONTIN) 10 MG 12 hr tablet Take 10 mg by mouth every 4 (four) hours as needed.    [provider]  oxyCODONE (ROXICODONE) 5 MG immediate release tablet Take 1 tablet (5 mg total) by mouth every 6 (six) hours as needed for severe pain. 03/19/23   Sloan Leiter, DO   oxyCODONE-acetaminophen (PERCOCET) 5-325 MG tablet Take 1 tablet by mouth every 6 (six) hours as needed. 01/16/23   Jacalyn Lefevre, MD  pravastatin (PRAVACHOL) 20 MG tablet Take 10 mg by mouth daily.     [provider]  predniSONE (STERAPRED UNI-PAK 21 TAB) 10 MG (21) TBPK tablet Take by mouth daily. Take 6 tabs by mouth daily  for 2 days, then 5 tabs for 2 days, then 4 tabs for 2 days, then 3 tabs for 2 days, 2 tabs for 2 days, then 1 tab by mouth daily for 2 days 08/30/18   Emi Holes, PA-C                                                                                                                                    Past Surgical History Past Surgical History:  Procedure Laterality Date   ABDOMINAL HYSTERECTOMY     FEMORAL ARTERY STENT     Family History History reviewed. No pertinent family history.  Social History Social History   Tobacco Use   Smoking status: Former    Current packs/day: 0.00    Types: Cigarettes    Quit date: 09/08/2008    Years since quitting: 14.6   Smokeless tobacco: Never  Substance Use Topics   Alcohol use: No   Drug use: No   Allergies Hydrocodone-acetaminophen  Review of Systems Review of Systems  All other systems reviewed and are negative.   Physical Exam Vital Signs  I have reviewed the triage vital signs BP (!) 148/73 (BP Location: Right Wrist)   Pulse 65   Temp 98.3 F (36.8 C)   Resp 20   Ht 5\' 2"  (1.575 m)   Wt 134.3 kg   SpO2 98%   BMI 54.14 kg/m  Physical Exam Vitals and nursing note reviewed.  Constitutional:      General: She is not in acute distress.    Appearance: She is well-developed.  HENT:     Head: Normocephalic and atraumatic.     Mouth/Throat:     Mouth: Mucous membranes are moist.  Eyes:     Pupils: Pupils are equal, round, and reactive to light.  Cardiovascular:     Rate and Rhythm: Normal rate and regular rhythm.     Heart sounds: No murmur heard. Pulmonary:     Effort: Pulmonary  effort is normal. No respiratory distress.     Breath sounds: Normal breath sounds.  Abdominal:     General: Abdomen is flat.     Palpations: Abdomen is soft.     Tenderness: There is no abdominal tenderness.  Musculoskeletal:        General: No tenderness.     Right lower leg: Edema present.     Left lower leg: Edema present.     Comments: Also 1+ pitting edema to the left upper extremity  Skin:    General: Skin is warm and dry.  Neurological:     General: No focal deficit present.     Mental Status: She is alert. Mental status is at baseline.  Psychiatric:        Mood and Affect: Mood normal.        Behavior: Behavior normal.     ED Results and Treatments Labs (all labs ordered are listed, but only abnormal results are displayed) Labs Reviewed  BASIC METABOLIC PANEL - Abnormal; Notable for the following components:      Result Value   Creatinine, Ser 1.14 (*)    GFR, Estimated 51 (*)    All other components within normal limits  CBC - Abnormal; Notable for the following components:   Hemoglobin 11.4 (*)    RDW 17.3 (*)    All other components within normal limits  BRAIN NATRIURETIC PEPTIDE  TROPONIN I (HIGH SENSITIVITY)  TROPONIN I (HIGH SENSITIVITY)                                                                                                                          Radiology DG Chest 2 View  Result Date: 04/26/2023 CLINICAL DATA:  Left arm pain, chest pain EXAM: CHEST - 2 VIEW COMPARISON:  03/19/2023 FINDINGS: Heart is borderline in size. Mediastinal contours within normal limits. Aortic atherosclerosis. Mild vascular congestion. No confluent airspace opacities or effusions. No acute bony abnormality. IMPRESSION: Borderline heart size.  Mild vascular congestion. Electronically Signed   By: Charlett Nose M.D.   On: 04/26/2023 22:08    Pertinent labs & imaging results that were available during my care of the patient were reviewed by me and considered in my medical  decision making (see MDM for details).  Medications Ordered in ED Medications - No data to display  Procedures Procedures  (including critical care time)  Medical Decision Making / ED Course   MDM:  77 year old female presenting to the emergency department with shortness of breath.  Patient overall well-appearing, vital signs reassuring.  No hypoxia.  Differential includes CHF although patient has had previous workup at outside hospital which was apparently unremarkable.  Less likely cardiac cause, had catheterization last year which mainly showed mild nonobstructive cardiac disease, troponin here negative.  Could also include structural process given left arm swelling so we will obtain CT as x-ray does not show any underlying process.  Could represent pulmonary embolism, on review patient D-dimer is essentially always elevated and plan to obtain CT anyway so we will obtain CT angiography although seems less likely to be pulmonary embolism.  Would also ideally obtain ultrasound of the left upper extremity given edema however ultrasound done for today so ordered outpatient ultrasound for tomorrow.  Will reassess  Clinical Course as of 04/26/23 2337  Wynelle Link Apr 26, 2023  2335 Signed out to oncoming provider pending CTA chest, troponin.  Offered patient dose of Lasix but she refused that she already took it today.  Discussed that it would be because she is having ongoing leg swelling however she says she does not need it.  She is not hypoxic so we will defer.  Discussed that if her testing is negative she will need to come back tomorrow for an outpatient ultrasound of her left upper extremity given swelling. [WS]    Clinical Course User Index [WS] Suezanne Jacquet, Jerilee Field, MD     Additional history obtained: -Additional history obtained from family -External records  from outside source obtained and reviewed including: Chart review including previous notes, labs, imaging, consultation notes including prevous [facility] notes, cardiology notes including LHC 1 yr ago   Lab Tests: -I ordered, reviewed, and interpreted labs.   The pertinent results include:   Labs Reviewed  BASIC METABOLIC PANEL - Abnormal; Notable for the following components:      Result Value   Creatinine, Ser 1.14 (*)    GFR, Estimated 51 (*)    All other components within normal limits  CBC - Abnormal; Notable for the following components:   Hemoglobin 11.4 (*)    RDW 17.3 (*)    All other components within normal limits  BRAIN NATRIURETIC PEPTIDE  TROPONIN I (HIGH SENSITIVITY)  TROPONIN I (HIGH SENSITIVITY)    Notable for mild anemia, normal troponin  EKG   EKG Interpretation Date/Time:  Sunday April 26 2023 21:24:13 EDT Ventricular Rate:  67 PR Interval:  182 QRS Duration:  108 QT Interval:  403 QTC Calculation: 426 R Axis:   7  Text Interpretation: Sinus rhythm Low voltage, precordial leads Abnormal R-wave progression, early transition Confirmed by Alvino Blood ([id number]) on 04/26/2023 11:23:11 PM         Imaging Studies ordered: I ordered imaging studies including CXR On my interpretation imaging demonstrates no acute process I independently visualized and interpreted imaging. I agree with the radiologist interpretation   Medicines ordered and prescription drug management: Meds ordered this encounter  Medications   DISCONTD: furosemide (LASIX) injection 40 mg    -I have reviewed the patients home medicines and have made adjustments as needed  Cardiac Monitoring: The patient was maintained on a cardiac monitor.  I personally viewed and interpreted the cardiac monitored which showed an underlying rhythm of: NSR  Social Determinants of Health:  Diagnosis or treatment significantly limited by social determinants of health: obesity  Co morbidities  that complicate the patient evaluation  Past Medical History:  Diagnosis Date   Chest pain, atypical    Chronic renal failure    stage 3   Diabetes mellitus without complication (HCC)    Diverticulitis    High cholesterol    Hyperlipidemia    Hypertension    Peripheral vascular disease (HCC)       Dispostion: Disposition decision including need for hospitalization was considered, and patient disposition pending at time of sign out.    Final Clinical Impression(s) / ED Diagnoses Final diagnoses:  Chest pain, unspecified type  Left arm swelling     This chart was dictated using voice recognition software.  Despite best efforts to proofread,  errors can occur which can change the documentation meaning.    Lonell Grandchild, MD 04/26/23 2337

## 2023-04-26 NOTE — ED Notes (Signed)
Patient transported to X-ray 

## 2023-04-26 NOTE — ED Triage Notes (Signed)
L arm pain/swelling and CP x1 week. SHOB on exertion

## 2023-04-26 NOTE — ED Notes (Signed)
Patient returned to rm 5 s/p imaging.  No change in condition.

## 2023-04-26 NOTE — ED Notes (Signed)
Staff@[email or web address] .  Will advise when patient can transport to imaging.

## 2023-04-27 ENCOUNTER — Ambulatory Visit (HOSPITAL_BASED_OUTPATIENT_CLINIC_OR_DEPARTMENT_OTHER): Admit: 2023-04-27 | Payer: 59

## 2023-04-27 ENCOUNTER — Emergency Department (HOSPITAL_BASED_OUTPATIENT_CLINIC_OR_DEPARTMENT_OTHER): Payer: 59

## 2023-04-27 NOTE — ED Notes (Signed)
Pt return from CT scanner, NAD noted, A&O x4, GCS 15

## 2023-04-27 NOTE — ED Notes (Signed)
Upper Ext Left Venous US  IMPORTANT PATIENT INSTRUCTIONS:  You have been scheduled for an Outpatient Ultrasound.    Your appointment has been scheduled for:  _1300_pm on ___8/19/2024__

## 2023-04-27 NOTE — ED Notes (Signed)
Patient transported to CT 

## 2024-08-31 ENCOUNTER — Other Ambulatory Visit: Payer: Self-pay

## 2024-08-31 ENCOUNTER — Emergency Department (HOSPITAL_BASED_OUTPATIENT_CLINIC_OR_DEPARTMENT_OTHER)

## 2024-08-31 ENCOUNTER — Emergency Department (HOSPITAL_BASED_OUTPATIENT_CLINIC_OR_DEPARTMENT_OTHER)
Admission: EM | Admit: 2024-08-31 | Discharge: 2024-09-01 | Disposition: A | Discharge status: Home / Self Care | Attending: Emergency Medicine | Admitting: Emergency Medicine

## 2024-08-31 ENCOUNTER — Encounter (HOSPITAL_BASED_OUTPATIENT_CLINIC_OR_DEPARTMENT_OTHER): Payer: Self-pay | Admitting: Emergency Medicine

## 2024-08-31 DIAGNOSIS — Z79899 Other long term (current) drug therapy: Secondary | ICD-10-CM | POA: Diagnosis not present

## 2024-08-31 DIAGNOSIS — R0789 Other chest pain: Secondary | ICD-10-CM | POA: Diagnosis present

## 2024-08-31 DIAGNOSIS — R072 Precordial pain: Secondary | ICD-10-CM | POA: Insufficient documentation

## 2024-08-31 DIAGNOSIS — N189 Chronic kidney disease, unspecified: Secondary | ICD-10-CM | POA: Insufficient documentation

## 2024-08-31 DIAGNOSIS — R600 Localized edema: Secondary | ICD-10-CM | POA: Diagnosis not present

## 2024-08-31 DIAGNOSIS — I509 Heart failure, unspecified: Secondary | ICD-10-CM | POA: Insufficient documentation

## 2024-08-31 DIAGNOSIS — Z7984 Long term (current) use of oral hypoglycemic drugs: Secondary | ICD-10-CM | POA: Insufficient documentation

## 2024-08-31 DIAGNOSIS — I130 Hypertensive heart and chronic kidney disease with heart failure and stage 1 through stage 4 chronic kidney disease, or unspecified chronic kidney disease: Secondary | ICD-10-CM | POA: Diagnosis not present

## 2024-08-31 DIAGNOSIS — Z7902 Long term (current) use of antithrombotics/antiplatelets: Secondary | ICD-10-CM | POA: Diagnosis not present

## 2024-08-31 LAB — BASIC METABOLIC PANEL WITH GFR
Anion gap: 12 (ref 5–15)
BUN: 20 mg/dL (ref 8–23)
CO2: 21 mmol/L — ABNORMAL LOW (ref 22–32)
Calcium: 9.4 mg/dL (ref 8.9–10.3)
Chloride: 105 mmol/L (ref 98–111)
Creatinine, Ser: 0.92 mg/dL (ref 0.44–1.00)
GFR, Estimated: >60 mL/min
Glucose, Bld: 99 mg/dL (ref 70–99)
Potassium: 4.4 mmol/L (ref 3.5–5.1)
Sodium: 138 mmol/L (ref 135–145)

## 2024-08-31 LAB — CBC
HCT: 42.3 % (ref 36.0–46.0)
Hemoglobin: 13.6 g/dL (ref 12.0–15.0)
MCH: 26.5 pg (ref 26.0–34.0)
MCHC: 32.2 g/dL (ref 30.0–36.0)
MCV: 82.3 fL (ref 80.0–100.0)
Platelets: 254 K/uL (ref 150–400)
RBC: 5.14 MIL/uL — ABNORMAL HIGH (ref 3.87–5.11)
RDW: 16 % — ABNORMAL HIGH (ref 11.5–15.5)
WBC: 9.3 K/uL (ref 4.0–10.5)
nRBC: 0 % (ref 0.0–0.2)

## 2024-08-31 LAB — PRO BRAIN NATRIURETIC PEPTIDE: Pro Brain Natriuretic Peptide: 143 pg/mL

## 2024-08-31 LAB — TROPONIN T, HIGH SENSITIVITY
Troponin T High Sensitivity: 20 ng/L — ABNORMAL HIGH (ref 0–19)
Troponin T High Sensitivity: 20 ng/L — ABNORMAL HIGH (ref 0–19)

## 2024-08-31 NOTE — ED Provider Notes (Signed)
 " [place] [facility] Provider Note   CSN: [id number] Arrival date & time: 08/31/24  2046     Patient presents with: Chest Pain   Holly Perkins is a 78 y.o. female.  {Add pertinent medical, surgical, social history, OB history to YEP:67052} The history is provided by the patient.  Chest Pain Holly Perkins is a 78 y.o. female who presents to the Emergency Department complaining of chest pain.  She presents to the emergency department for evaluation of chest pain that started at 2 PM.  Pain is described as a central discomfort/tightness that waxes and wanes and lasted for about an hour and a half.  When she did have the pain it radiated to her neck and left shoulder.  She has not had any pain since she has been in the emergency department.  She did have a similar but briefer episode 1 week ago.  Her pain today started while she was watching TV.  When her pain was present she had nausea.  No associate diaphoresis shortness of breath, abdominal pain.  She has chronic lower extremity edema and feels like it has been worse over the last 24 hours.  She also feels like her right leg is more swollen than baseline.  She does baseline have increased welling to the right lower extremity.  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     Prior to Admission medications  Medication Sig Start Date End Date Taking? Authorizing Provider  acetaminophen  (TYLENOL ) 325 MG tablet Take 2 tablets (650 mg total) by mouth every 6 (six) hours as needed. 03/19/23   Elnor Jayson LABOR, DO  allopurinol  (ZYLOPRIM ) 100 MG tablet Take 100 mg by mouth daily.    [provider]  B Complex Vitamins (B-COMPLEX/B-12 PO) Take by mouth.      [provider]  benzonatate  (TESSALON ) 100 MG capsule Take 1 capsule (100 mg total) by mouth every 8 (eight) hours. 09/17/14   Cleotilde Rogue, MD  clopidogrel  (PLAVIX ) 75 MG tablet Take 75 mg by  mouth daily.      [provider]  cyclobenzaprine  (FLEXERIL ) 5 MG tablet Take 1 tablet (5 mg total) by mouth 2 (two) times daily as needed for muscle spasms. 03/19/23   Elnor Jayson LABOR, DO  ergocalciferol (VITAMIN D2) 50000 UNITS capsule Take 50,000 Units by mouth once a week.    [provider]  furosemide  (LASIX ) 40 MG tablet Take 1 tablet (40 mg total) by mouth daily. 01/02/20   Horton, Charmaine FALCON, MD  hydrochlorothiazide  (HYDRODIURIL ) 25 MG tablet Take 25 mg by mouth daily.      [provider]  HYDROcodone -acetaminophen  (NORCO/VICODIN) 5-325 MG tablet Take 1-2 tablets by mouth every 6 (six) hours as needed. 06/26/19   Layden, Lindsey A, PA-C  ibuprofen  (ADVIL ) 600 MG tablet Take 1 tablet (600 mg total) by mouth every 6 (six) hours as needed. 03/19/23   Elnor Jayson LABOR, DO  levofloxacin (LEVAQUIN) 500 MG tablet Take 500 mg by mouth daily.    [provider]  lidocaine  (LIDODERM ) 5 % Place 1 patch onto the skin daily as needed. Remove & Discard patch within 12 hours or as directed by MD 03/19/23   Elnor Jayson A, DO  lisinopril  (PRINIVIL ,ZESTRIL ) 40 MG tablet Take 40 mg by mouth daily.      [provider]  metFORMIN  (GLUCOPHAGE ) 500 MG tablet Take by mouth 2 (two) times daily with a  meal.    [provider]  methylPREDNISolone  (MEDROL  DOSEPAK) 4 MG TBPK tablet Use as directed 09/24/22   Patt Alm Macho, MD  metroNIDAZOLE (FLAGYL) 500 MG tablet Take 500 mg by mouth 3 (three) times daily.    [provider]  omeprazole  (PRILOSEC) 20 MG capsule Take 1 capsule (20 mg total) by mouth 2 (two) times daily. 03/04/17   Lynwood Anes, MD  ondansetron  (ZOFRAN  ODT) 4 MG disintegrating tablet Take 1 tablet (4 mg total) by mouth every 8 (eight) hours as needed for nausea. 09/17/14   Cleotilde Rogue, MD  ondansetron  (ZOFRAN -ODT) 4 MG disintegrating tablet 4mg  ODT q4 hours prn nausea/vomit 09/24/22   Patt Alm Macho, MD  oxyCODONE  (OXYCONTIN ) 10 MG 12 hr  tablet Take 10 mg by mouth every 4 (four) hours as needed.    [provider]  oxyCODONE  (ROXICODONE ) 5 MG immediate release tablet Take 1 tablet (5 mg total) by mouth every 6 (six) hours as needed for severe pain. 03/19/23   Elnor Jayson LABOR, DO  oxyCODONE -acetaminophen  (PERCOCET) 5-325 MG tablet Take 1 tablet by mouth every 6 (six) hours as needed. 01/16/23   Dean Clarity, MD  pravastatin  (PRAVACHOL ) 20 MG tablet Take 10 mg by mouth daily.     [provider]  predniSONE  (STERAPRED UNI-PAK 21 TAB) 10 MG (21) TBPK tablet Take by mouth daily. Take 6 tabs by mouth daily  for 2 days, then 5 tabs for 2 days, then 4 tabs for 2 days, then 3 tabs for 2 days, 2 tabs for 2 days, then 1 tab by mouth daily for 2 days 08/30/18   Rendell Lorane HERO, PA-C    Allergies: Hydrocodone -acetaminophen     Review of Systems  Cardiovascular:  Positive for chest pain.  All other systems reviewed and are negative.   Updated Vital Signs BP (!) 162/94 (BP Location: Left Arm)   Pulse 66   Temp 98.8 F (37.1 C) (Oral)   Resp 18   Ht 5' 2 (1.575 m)   Wt 131.4 kg   SpO2 100%   BMI 52.98 kg/m   Physical Exam Vitals and nursing note reviewed.  Constitutional:      Appearance: She is well-developed.  HENT:     Head: Normocephalic and atraumatic.  Cardiovascular:     Rate and Rhythm: Normal rate and regular rhythm.     Heart sounds: No murmur heard. Pulmonary:     Effort: Pulmonary effort is normal. No respiratory distress.     Breath sounds: Normal breath sounds.  Abdominal:     Palpations: Abdomen is soft.     Tenderness: There is no abdominal tenderness. There is no guarding or rebound.  Musculoskeletal:        General: No tenderness.     Comments: Edema to BLE, right greater than left.   Skin:    General: Skin is warm and dry.  Neurological:     Mental Status: She is alert and oriented to person, place, and time.  Psychiatric:        Behavior: Behavior normal.     (all labs  ordered are listed, but only abnormal results are displayed) Labs Reviewed  BASIC METABOLIC PANEL WITH GFR - Abnormal; Notable for the following components:      Result Value   CO2 21 (*)    All other components within normal limits  CBC - Abnormal; Notable for the following components:   RBC 5.14 (*)    RDW 16.0 (*)  All other components within normal limits  TROPONIN T, HIGH SENSITIVITY - Abnormal; Notable for the following components:   Troponin T High Sensitivity 20 (*)    All other components within normal limits  TROPONIN T, HIGH SENSITIVITY - Abnormal; Notable for the following components:   Troponin T High Sensitivity 20 (*)    All other components within normal limits  PRO BRAIN NATRIURETIC PEPTIDE    EKG: None  Radiology: DG Chest 2 View Result Date: 08/31/2024 CLINICAL DATA:  Chest pain. EXAM: CHEST - 2 VIEW COMPARISON:  Radiograph and CT 04/26/2023 FINDINGS: The cardiomediastinal contours are stable. Pulmonary vasculature is normal. No consolidation, pleural effusion, or pneumothorax. No acute osseous abnormalities are seen. IMPRESSION: No active cardiopulmonary disease. Electronically Signed   By: Andrea Gasman M.D.   On: 08/31/2024 21:19    {Document cardiac monitor, telemetry assessment procedure when appropriate:32947} Procedures   Medications Ordered in the ED - No data to display    {Click here for ABCD2, HEART and other calculators REFRESH Note before signing:1}                              Medical Decision Making Amount and/or Complexity of Data Reviewed Labs: ordered. Radiology: ordered.   Patient with history of hypertension, CHF, CKD, femoropopliteal bypass on Plavix  here for evaluation of episodic chest pain, resolved to ED evaluation.  EKG is nonischemic, troponins are flat at 20.  She is status postcardiac cath in 2023 at [facility].  She had borderline stenosis of LAD at 60 to 70% otherwise mild nonobstructive coronary artery  disease.  {Document critical care time when appropriate  Document review of labs and clinical decision tools ie CHADS2VASC2, etc  Document your independent review of radiology images and any outside records  Document your discussion with family members, caretakers and with consultants  Document social determinants of health affecting pt's care  Document your decision making why or why not admission, treatments were needed:32947:::1}   Final diagnoses:  None    ED Discharge Orders     None        "

## 2024-08-31 NOTE — ED Triage Notes (Signed)
 Pt c/o mid chest pain, pain radiated to L jaw and left arm, earlier today; denies at time of triage. Reports similar instance last week.   + shob.   Also c/o RLE swelling, worse this week, been ongoing for multiple years.   Took 1 81 mg aspirin earlier today. Reports compliance with medications.

## 2024-08-31 NOTE — ED Notes (Signed)
 Patient transferred from waiting room to ED treatment room. Assuming pt care at this time.

## 2024-09-01 ENCOUNTER — Emergency Department (HOSPITAL_BASED_OUTPATIENT_CLINIC_OR_DEPARTMENT_OTHER)

## 2024-09-01 MED ORDER — IOHEXOL 350 MG/ML SOLN
75.0000 mL | Freq: Once | INTRAVENOUS | Status: AC | PRN
  Administered 2024-09-01: 75 mL via INTRAVENOUS

## 2024-09-01 NOTE — ED Notes (Signed)
Patient transported to imaging at this time.
